# Patient Record
Sex: Male | Born: 2000 | Race: White | Hispanic: No | Marital: Single
Health system: Midwestern US, Community
[De-identification: ages and names within clinical notes are randomized; demographics above are authoritative.]

## PROBLEM LIST (undated history)

## (undated) DIAGNOSIS — S022XXA Fracture of nasal bones, initial encounter for closed fracture: Secondary | ICD-10-CM

## (undated) DIAGNOSIS — B360 Pityriasis versicolor: Secondary | ICD-10-CM

## (undated) HISTORY — DX: Fracture of nasal bones, initial encounter for closed fracture: S02.2XXA

## (undated) MED ORDER — ECONAZOLE 1 % TOPICAL CREAM
1 % | Freq: Two times a day (BID) | CUTANEOUS | Status: DC
Start: ? — End: 2013-09-15

---
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
## 2019-01-28 IMAGING — CT CT MAXILLOFACIAL W/O CM
3 of 5 series · 15 of 33 positions shown, 18 images · non-contrast
Comparison: None.

CLINICAL DATA: Initial evaluation for recent nasal trauma, pain,
evaluate for fracture.

EXAM:
CT MAXILLOFACIAL WITHOUT CONTRAST
TECHNIQUE: Multidetector CT imaging of the maxillofacial structures was
performed. Multiplanar CT image reconstructions were also generated.

[Series 4: facial/ orbits 2.0 h30s · axial · 0.28mm/px · z∈[+26,+136]mm · 11 of 67 slices shown, 14 images]
[im 6/67  soft-tissue]
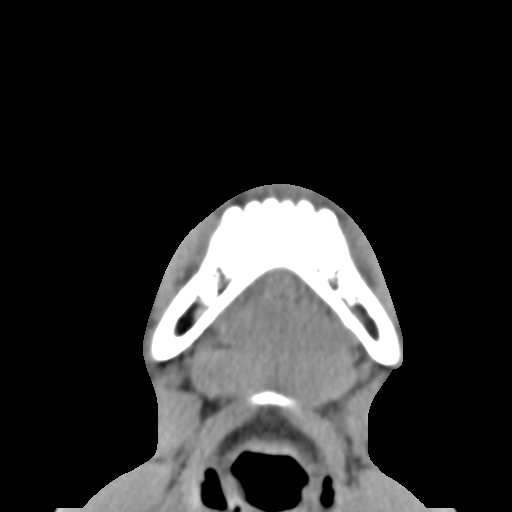
[im 6/67  bone]
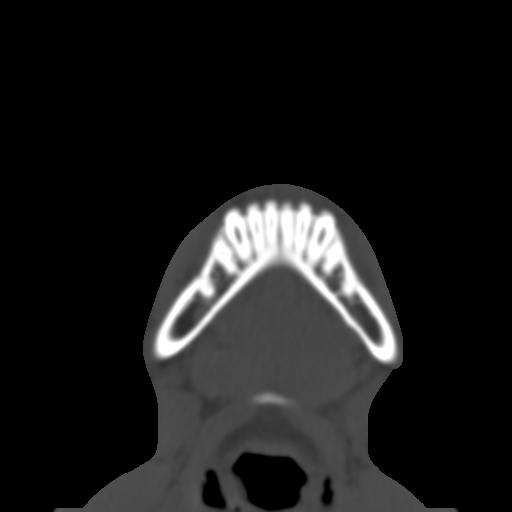
[im 12/67  bone]
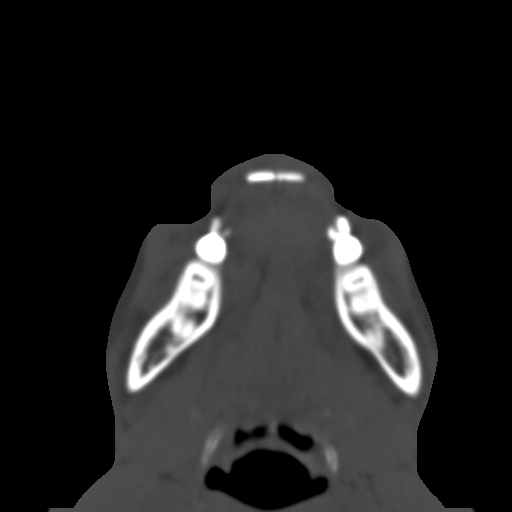
[im 17/67  bone]
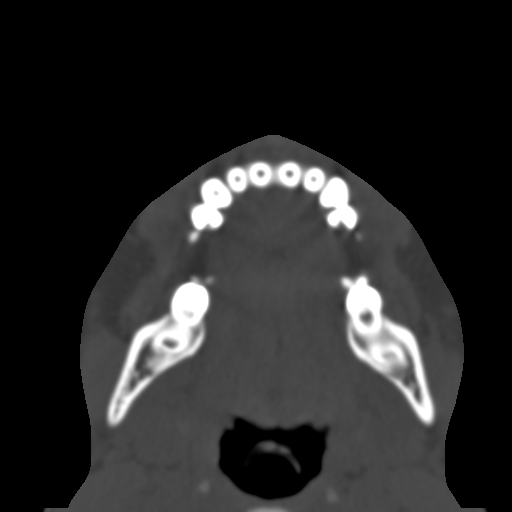
[im 23/67  bone]
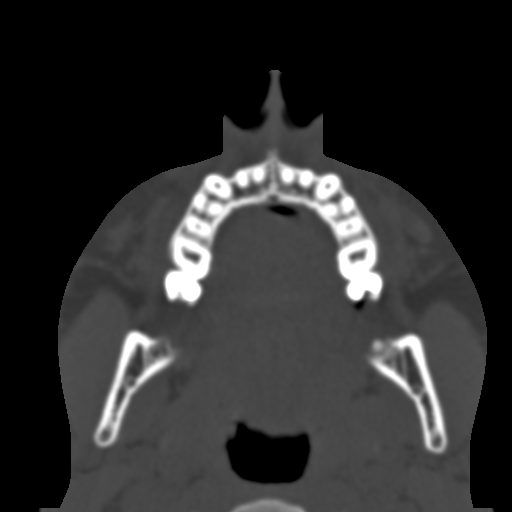
[im 28/67  soft-tissue]
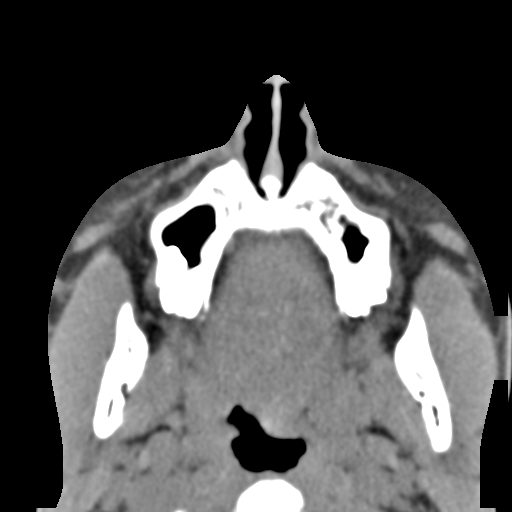
[im 28/67  bone]
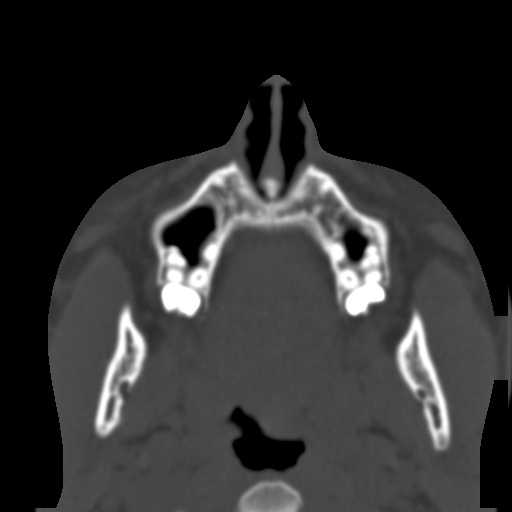
[im 34/67  bone]
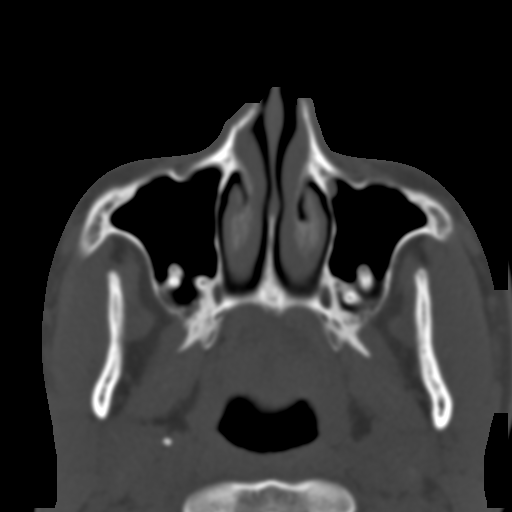
[im 39/67  bone]
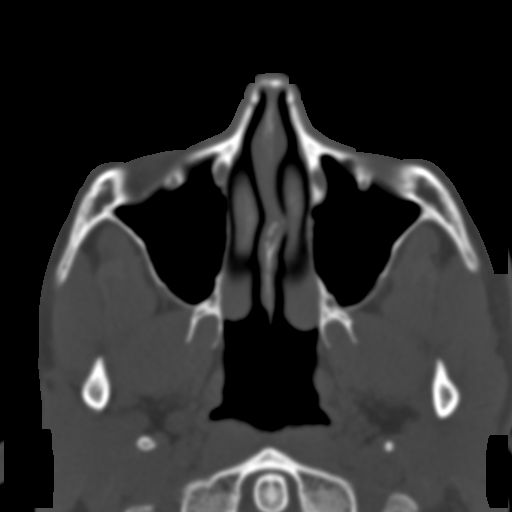
[im 45/67  bone]
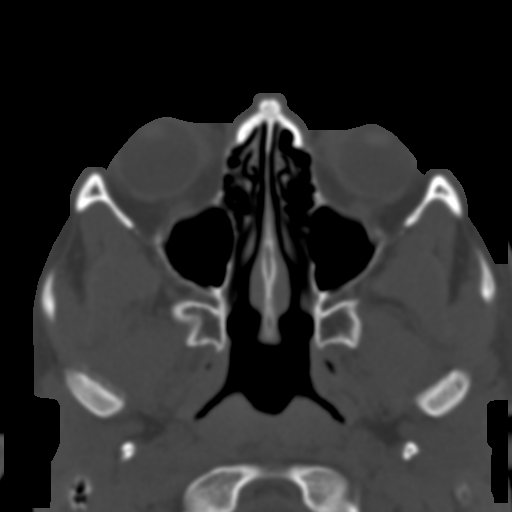
[im 50/67  soft-tissue]
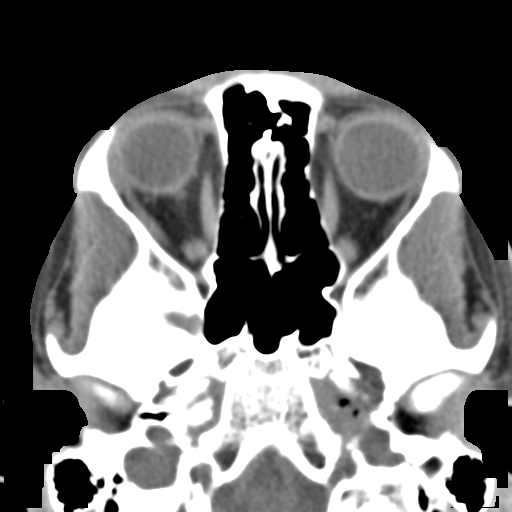
[im 50/67  bone]
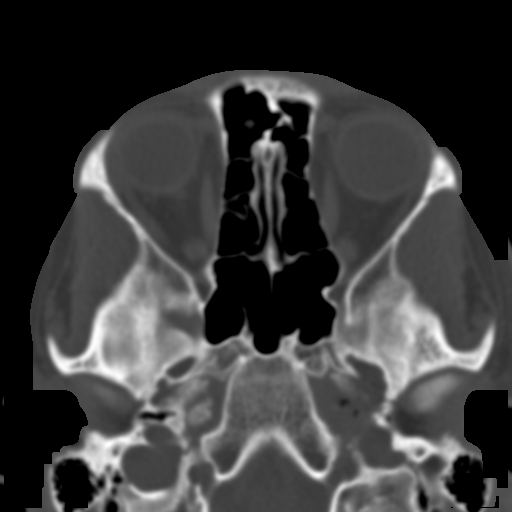
[im 56/67  bone]
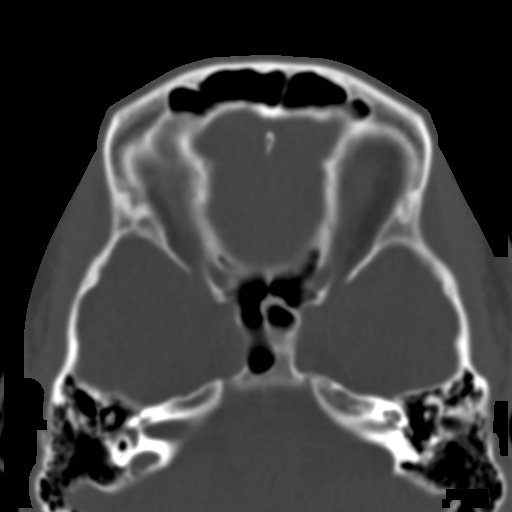
[im 61/67  bone]
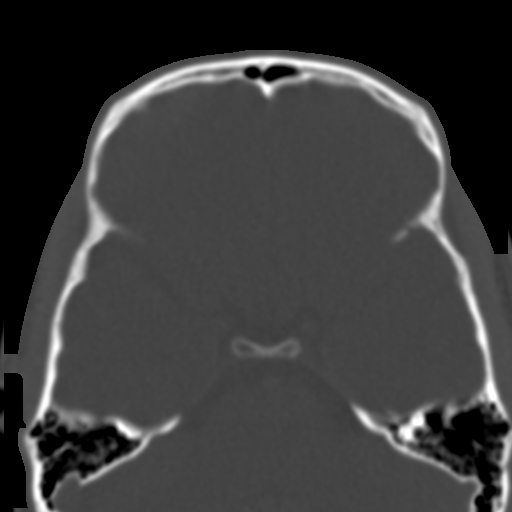

[Series 9: sagittal soft tissue · sagittal · 0.27mm/px · 3 of 78 slices shown]
[im 33/78  bone]
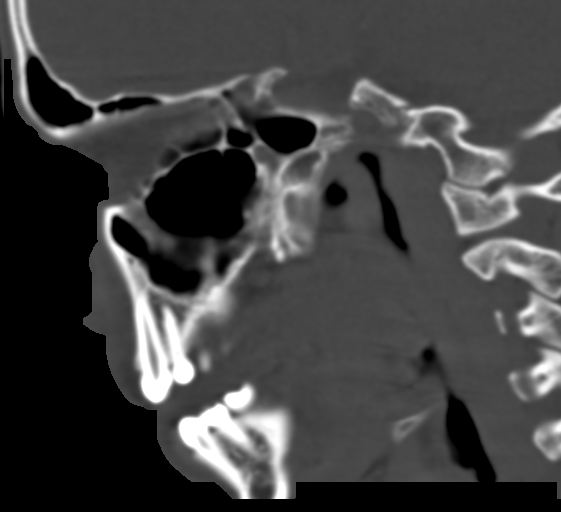
[im 39/78  bone]
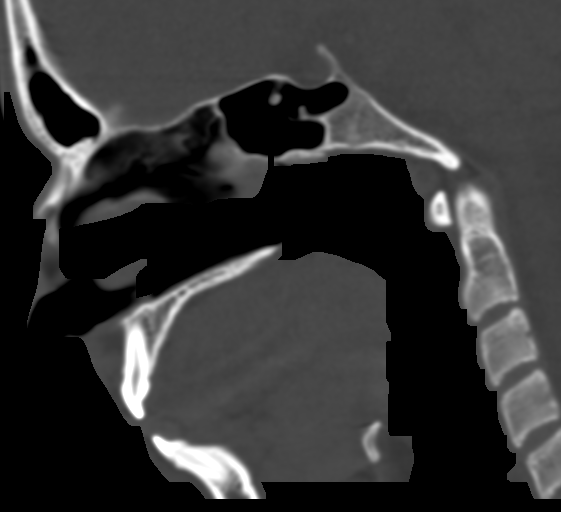
[im 45/78  bone]
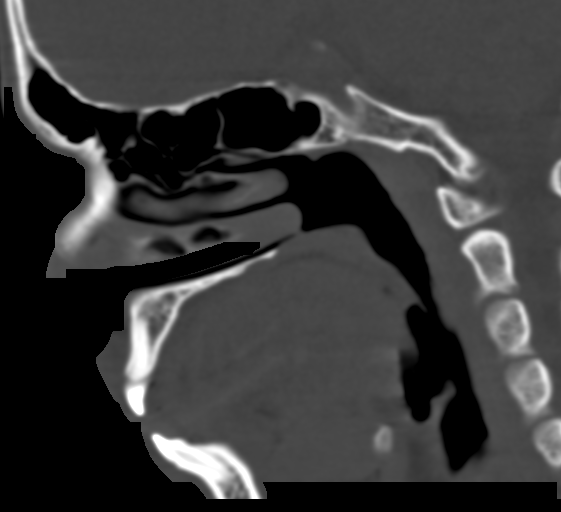

[Series 10: coronal bone · coronal · 0.29mm/px · 1 of 85 slices shown]
[im 43/85  bone]
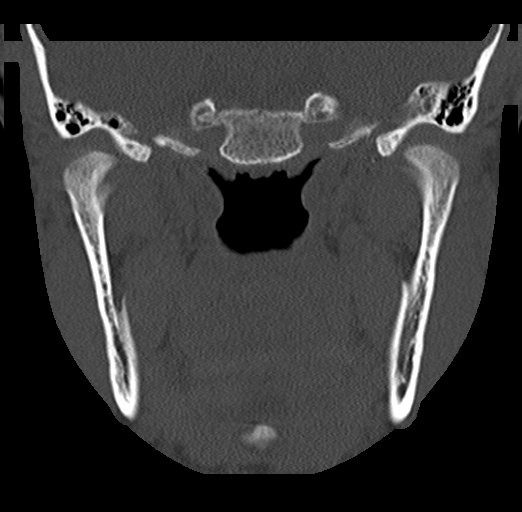

[15 of 33 positions shown; findings below may reference images not displayed]

FINDINGS: Osseous: Zygomatic arches intact. No acute maxillary fracture.
Pterygoid plates intact. There is an acute fracture involving the
right nasal bone with 2 mm depression (series 5, image 33). Left
nasal bone intact. Mild S-shaped bowing of the nasal septum which
remains intact. Visualized mandible intact. No acute abnormality
about the dentition.

Orbits: Globes and orbital soft tissues within normal limits. Bony
orbits intact.

Sinuses: Paranasal sinuses are clear. Visualized mastoids and middle
ear cavities are well pneumatized and clear.

Soft tissues: No appreciable soft tissue swelling by CT

Limited intracranial: Unremarkable.
IMPRESSION: Acute fracture involving the right nasal bone with 2 mm of
depression.

## 2019-10-04 ENCOUNTER — Other Ambulatory Visit: Payer: Self-pay

## 2019-10-04 ENCOUNTER — Ambulatory Visit (INDEPENDENT_AMBULATORY_CARE_PROVIDER_SITE_OTHER): Payer: BC Managed Care – PPO | Admitting: Internal Medicine

## 2019-10-04 ENCOUNTER — Encounter: Payer: Self-pay | Admitting: Internal Medicine

## 2019-10-04 VITALS — BP 106/74 | HR 78 | Temp 98.5°F | Wt 140.0 lb

## 2019-10-04 DIAGNOSIS — R0989 Other specified symptoms and signs involving the circulatory and respiratory systems: Secondary | ICD-10-CM

## 2019-10-04 DIAGNOSIS — R21 Rash and other nonspecific skin eruption: Secondary | ICD-10-CM

## 2019-10-04 DIAGNOSIS — R198 Other specified symptoms and signs involving the digestive system and abdomen: Secondary | ICD-10-CM

## 2019-10-04 MED ORDER — OMEPRAZOLE 20 MG PO CPDR: 20.0000 mg | DELAYED_RELEASE_CAPSULE | Freq: Every day | ORAL | 0 refills | Status: DC

## 2019-10-04 NOTE — Progress Notes (Signed)
Subjective:    Patient ID: Taylor Carr, male    DOB: [birth year], 19 y.o.   MRN: [id number]  HPI  Pt presents to the clinic today with c/o a sensation of "a lump in my throat". He noticed this 2-3 days ago. He reports he can drink water slowly but is having a hard time with other liquids and solids. He has not choked but he feels like for some reason things aren't passing. He reports he has had to cough food up and out after feeling like it wasn't moving. He denies post nasal drip, sore throat, cough, SOB, nausea, vomiting, reflux, abdominal pain, constipation, diarrhea or blood in his stool. He has tried Ibuprofen OTC with minimal relief.  He also c/p a rash to his left upper arm. He has seen derm in the past, unsure of what his diagnosis is. He was prescribed Selenium Sulfide shampoo which he has been using but reports no improvement. He reports this rash tends to get worse in the winter, better in the summer. He would like to know if there is anything else he can do to get this to go away.  Review of Systems      No past medical history on file.  Current Outpatient Medications  Medication Sig Dispense Refill  . omeprazole (PRILOSEC) 20 MG capsule Take 1 capsule (20 mg total) by mouth daily. 14 capsule 0   No current facility-administered medications for this visit.    No Known Allergies  No family history on file.  Social History   Socioeconomic History  . Marital status: Single    Spouse name: Not on file  . Number of children: Not on file  . Years of education: Not on file  . Highest education level: Not on file  Occupational History  . Not on file  Tobacco Use  . Smoking status: Never Smoker  . Smokeless tobacco: Never Used  Substance and Sexual Activity  . Alcohol use: No  . Drug use: No  . Sexual activity: Not on file  Other Topics Concern  . 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   Lives with mom, dad, brother, sister.   Enjoys playing basketball.   Social Determinants of Health   Financial Resource Strain:   . Difficulty of Paying Living Expenses: Not on file  Food Insecurity:   . Worried About Charity fundraiser in the Last Year: Not on file  . Ran Out of Food in the Last Year: Not on file  Transportation Needs:   . Lack of Transportation (Medical): Not on file  . Lack of Transportation (Non-Medical): Not on file  Physical Activity:   . Days of Exercise per Week: Not on file  . Minutes of Exercise per Session: Not on file  Stress:   . Feeling of Stress : Not on file  Social Connections:   . Frequency of Communication with Friends and Family: Not on file  . Frequency of Social Gatherings with Friends and Family: Not on file  . Attends Religious Services: Not on file  . Active Member of Clubs or Organizations: Not on file  . Attends Archivist Meetings: Not on file  . Marital Status: Not on file  Intimate Partner Violence:   . Fear of Current or Ex-Partner: Not on file  . Emotionally Abused: Not on file  . Physically Abused: Not on file  . Sexually Abused: Not on file     Constitutional:  Denies fever, malaise, fatigue, headache or abrupt weight changes.  Skin: Pt reports rash of left upper arm. Denies redness, lesion or ulceration. HEENT: Denies eye pain, eye redness, ear pain, ringing in the ears, wax buildup, runny nose, nasal congestion, bloody nose, or sore throat. Respiratory: Denies difficulty breathing, shortness of breath, cough or sputum production.   Cardiovascular: Denies chest pain, chest tightness, palpitations or swelling in the hands or feet.  Gastrointestinal: Pt reports globus sensation. Denies abdominal pain, bloating, constipation, diarrhea or blood in the stool.  GU: Denies urgency, frequency, pain with urination, burning sensation, blood in urine, odor or discharge.  No other specific complaints in a complete review of systems (except as listed in HPI  above).  Objective:   Physical Exam  BP 106/74   Pulse 78   Temp 98.5 F (36.9 C) (Temporal)   Wt 140 lb (63.5 kg)   SpO2 98%   BMI 21.44 kg/m  Wt Readings from Last 3 Encounters:  10/04/19 140 lb (63.5 kg) (26 %, Z= -0.63)*  08/10/18 131 lb 8 oz (59.6 kg) (19 %, Z= -0.86)*  11/23/17 133 lb (60.3 kg) (27 %, Z= -0.61)*   * Growth percentiles are based on CDC (Boys, 2-20 Years) data.    General: Appears his stated age, well developed, well nourished in NAD. Skin: Grouped hypopigmented, some scaly lesions with central clearing noted of left upper arm.  HEENT: Head: normal shape and size; Eyes: sclera white, no icterus, conjunctiva pink, PERRLA and EOMs intact; Throat/Mouth: Teeth present, mucosa pink and moist, no exudate, lesions or ulcerations noted.  Neck:  Neck supple, trachea midline. No masses, lumps or thyromegaly present.  Cardiovascular: Normal rate and rhythm. S1,S2 noted.  No murmur, rubs or gallops noted.  Pulmonary/Chest: Normal effort and positive vesicular breath sounds. No respiratory distress. No wheezes, rales or ronchi noted.  Abdomen: Soft and nontender. Normal bowel sounds. No distention or masses noted.  Neurological: Alert and oriented.            Assessment & Plan:   Globus Sensation:  RX for Omeprazole 20 mg daily 30 minutes before breakfast If no improvement, consider referral to GI for further evaluation  Rash of Arm:  Follow up with derm if no improvement  Return precautions discussed Nicki Reaper, NP This visit occurred during the SARS-CoV-2 public health emergency.  Safety protocols were in place, including screening questions prior to the visit, additional usage of staff PPE, and extensive cleaning of exam room while observing appropriate contact time as indicated for disinfecting solutions.

## 2019-10-05 ENCOUNTER — Encounter: Payer: Self-pay | Admitting: Internal Medicine

## 2019-10-05 NOTE — Patient Instructions (Signed)

## 2019-12-06 ENCOUNTER — Ambulatory Visit: Payer: BC Managed Care – PPO | Admitting: Dermatology

## 2019-12-06 ENCOUNTER — Other Ambulatory Visit: Payer: Self-pay

## 2019-12-06 DIAGNOSIS — L305 Pityriasis alba: Secondary | ICD-10-CM

## 2019-12-06 DIAGNOSIS — L309 Dermatitis, unspecified: Secondary | ICD-10-CM

## 2019-12-06 MED ORDER — MOMETASONE FUROATE 0.1 % EX CREA: TOPICAL_CREAM | CUTANEOUS | 2 refills | Status: DC

## 2019-12-06 MED ORDER — PIMECROLIMUS 1 % EX CREA: TOPICAL_CREAM | CUTANEOUS | 2 refills | Status: DC

## 2019-12-06 NOTE — Progress Notes (Signed)
   New Patient Visit  Subjective  Taylor Carr is a 19 y.o. male who presents for the following: white patches (arms, legs, back). He states he gets scaly patches that come up in the winter, and gradually fade during the summer. The spots are itchy when present and uses Eucerin lotion. He has used [employer] in the past, but hasn't helped much.   The following portions of the chart were reviewed this encounter and updated as appropriate:      Review of Systems:  No other skin or systemic complaints except as noted in HPI or Assessment and Plan.  Objective  Well appearing patient in no apparent distress; mood and affect are within normal limits.  A focused examination was performed including arms, legs, back. Relevant physical exam findings are noted in the Assessment and Plan.  Objective  arms, legs: Scaly small papules and patches with hypopigmentation   Assessment & Plan  Eczema, unspecified type arms, legs  with PIPA (pityriasis alba)  Start Mometasone Cream Apply to affected areas rash BID until improved ~ 2 weeks. Avoid face, groin, axilla. Topical steroids (such as triamcinolone, fluocinolone, fluocinonide, mometasone, clobetasol, halobetasol, betamethasone, hydrocortisone) can cause thinning and lightening of the skin if they are used for too long in the same area. Your physician has selected the right strength medicine for your problem and area affected on the body. Please use your medication only as directed by your physician to prevent side effects.   Start Elidel Cream Apply to affected areas QD/BID until discoloration improved.  Continue Selsun Blue to scalp. If worsens or not improving, will send in prescription shampoo.  Recommend broad spectrum SPF 30+ and photoprotection   Recommend mild soap and moisturizing cream 1-2 times daily.      mometasone (ELOCON) 0.1 % cream - arms, legs  pimecrolimus (ELIDEL) 1 % cream - arms, legs  Return if symptoms worsen  or fail to improve.   ICherlyn Labella, CMA, am acting as scribe for Willeen Niece, MD .     Documentation: I have reviewed the above documentation for accuracy and completeness, and I agree with the above.  Willeen Niece, MD

## 2019-12-06 NOTE — Patient Instructions (Addendum)
Atopic Dermatitis ° °“Dermatitis” means inflammation of the skin.  “Atopic” dermatitis is a particular type of skin inflammation that is marked by dryness, associated itching, and a characteristic pattern of rash on the body.  The condition is fairly common and may occur in as many as 10% of children.  You will often hear it called “atopic eczema” or sometimes just “eczema”. ° °The exact cause of atopic dermatitis is unknown.  In many patients, there is a family history of hay fever, asthma, or atopic dermatitis itself.  Rarely, atopic dermatitis in infants may be related to food sensitivity, such as sensitivity to milk, but this is often difficult to determine and manage.  In the majority of cases, however, no allergic triggers can be found.  Physical or emotional stressors (severe seasonal allergies, physical illness, etc.) can worsen atopic dermatitis. ° °Atopic dermatitis usually starts in infancy from the ages of 2 to 6 months.  The skin is dry and the rash is quite itchy, so infants may be restless and rub against the sheets or scratch (if able).  The rash may involve the face or it may cover a large part of the body.  As the child gets older, the rash may become more localized.  In early childhood, the rash is commonly on the legs, feet, hands and arms.  As a child becomes older, the rash may be limited to the bend of the elbows, knees, on the back of the hands, feet, and on the neck and face.  When the rash becomes more established, the dry itchy skin may become thickened, leathery and sometimes darker in coloration.  The more the person scratches, the worse the rash is and the thicker the skin gets.  Many children with atopic dermatitis outgrow the condition before school age, while others continue to have problems into adolescence and adulthood. ° °Many things may affect the severity of the condition.  All patients have sensitive and dry skin.  Many will find that during the winter months when the humidity  is very low, the dryness and itchiness will be worse.  On the other hand, some people are easily irritated by sweat and will find that they have more problems during the summer months.  Most patients note an increase in itching at times when there are sudden changes in temperature.  Other irritants easily affect the skin of a patient with atopic dermatitis.  Use of harsh soaps or detergents and exposure to wool are common problems.  Sometimes atopic dermatitis may become infected by bacteria, yeast or viruses.  This is called “secondary infection”.  Bacterial secondary infection is the most common and is often a result of scratching.  The rash gets very red with pus-filled pimples and scabs.  If this occurs, your doctor will prescribe an antibiotic to control the infection.  A more serious complication can be caused by certain viruses.  The “cold sore” virus (herpes simplex) may cause a severe rash.  If this is suspected, immediately contact your doctor.  ° °What can I expect from treatment? °Unfortunately, there is no “magic” cure that will always eliminate atopic dermatitis.  The main objective in treating atopic dermatitis is to decrease the skin eruption and relieve the itching.  There are a number of different forms of the medications that are used for atopic dermatitis.  Primarily, topical medications will be used.  Because the skin is excessively dry, moisturizers will be recommended that will effectively decrease the dryness.  Daily bathing is   a useful way to get water into the skin but bathing should be brief (no more than 10 minutes unless otherwise indicated by your physician). ° °Effective moisturizers (Cetaphil cream or lotion, CeraVe cream or lotion [[employer], [employer], and Walgreens], Aquaphor, and plain Vaseline) can be used immediately after the bath or shower to trap moisture within the skin.  It is best to “pat dry” after a bathing and then place your moisturizer (cream or lotion) on your skin.   Cortisone (steroid) is a medicated ointment or cream (eg. triamcinolone, hydrocortisone, desonide, betamethasone, clobetasol) that may also be suggested.  It is very helpful in decreasing the itching and controlling the inflammation.  Your doctor will prescribe a cortisone treatment that is most appropriate for the severity and location of the dermatitis that is to be treated.  ° °Once the affected area clears up, it is best to discontinue the use of the cortisone preparation due to possibility of atrophy (skin thinning), but continue the regular use of moisturizers to try to prevent new areas of dermatitis from occurring.  Of course, if itching or a new rash begins, the cortisone preparation may have to be started again.  Anti-inflammatory creams and ointments which are not steroids such as Protopic and Elidel may also be prescribed. ° °Certain internal medicines called antihistamines (eg. Atarax, Benadryl, hydroxyzine) may help control itching.  They primarily help with the itching by introducing some drowsiness and allowing you to sleep at night.  Some oral antibiotics are often useful as well for controlling the secondary infection and enable infected dermatitis to be controlled. ° °Other important forms of treatment: °1. Avoid contact with substances you know to cause itching.  These may include soaps, detergents, certain perfumes, dust, grass, weeds, wools, and other types of scratchy clothing. °2. You may bathe daily.  Use no soap or the minimal amount necessary to get clean.  Always use moisturizer immediately after bathing (within 3 minutes is best).  Avoid very hot or very cold water.  Avoid bubble baths.  When drying with a towel, pat dry and do not rub. Use a mild, unscented soap (Dove, CeraVe Cleanser, Lever 2000, or Cetaphil). °3. Try to keep the temperature and humidity in the home fairly constant.  Use a bedroom air conditioner in the summer and a humidifier in the winter.  It is very important that  the humidifier be cleaned frequently and thoroughly since mold may grow and cause allergies. °4. Try to avoid scratching.  Atopic dermatitis is often called “the itch that rashes” and it is known that scratching plays a significant role in making atopic dermatitis worse.  Keeping the nails short and well-filed is helpful. °5. Use a fragrance-free, sensitive skin laundry detergent (eg. All Free & Clear).  Run clothes through a second rinse cycle to remove any residual detergents and chemicals.  Bed linens and towels should be washed in hot water to kill dust mites, which are common allergen in atopic patients. °6. In the bedroom, minimize rugs and curtains or other loose fabrics that collect dust. ° °The National Eczema Association (www.eczema-assn.org) is a wonderful organization that sends out a quarterly newsletter with useful information on these types of conditions. Please consider contacting them at the above website or by address: [employer] Ecz[employer] for [employer], [address], [address], [place], [address]  ° ° °

## 2020-09-13 DIAGNOSIS — R072 Precordial pain: Secondary | ICD-10-CM | POA: Diagnosis not present

## 2021-09-02 ENCOUNTER — Encounter: Payer: BC Managed Care – PPO | Admitting: Primary Care

## 2021-09-06 ENCOUNTER — Other Ambulatory Visit: Payer: Self-pay

## 2021-09-06 ENCOUNTER — Ambulatory Visit (INDEPENDENT_AMBULATORY_CARE_PROVIDER_SITE_OTHER): Payer: BC Managed Care – PPO | Admitting: Family

## 2021-09-06 ENCOUNTER — Telehealth: Payer: Self-pay

## 2021-09-06 ENCOUNTER — Encounter: Payer: Self-pay | Admitting: Family

## 2021-09-06 VITALS — BP 114/72 | HR 106 | Temp 97.9°F | Ht 67.75 in | Wt 139.0 lb

## 2021-09-06 DIAGNOSIS — R9431 Abnormal electrocardiogram [ECG] [EKG]: Secondary | ICD-10-CM | POA: Diagnosis not present

## 2021-09-06 DIAGNOSIS — F419 Anxiety disorder, unspecified: Secondary | ICD-10-CM | POA: Diagnosis not present

## 2021-09-06 DIAGNOSIS — R000 Tachycardia, unspecified: Secondary | ICD-10-CM | POA: Insufficient documentation

## 2021-09-06 DIAGNOSIS — F411 Generalized anxiety disorder: Secondary | ICD-10-CM | POA: Insufficient documentation

## 2021-09-06 LAB — T3, FREE: T3, Free: 3.4 pg/mL (ref 2.3–4.2)

## 2021-09-06 LAB — COMPREHENSIVE METABOLIC PANEL
ALT: 22 U/L (ref 0–53)
AST: 23 U/L (ref 0–37)
Albumin: 4.9 g/dL (ref 3.5–5.2)
Alkaline Phosphatase: 59 U/L (ref 39–117)
BUN: 17 mg/dL (ref 6–23)
CO2: 32 mEq/L (ref 19–32)
Calcium: 10.3 mg/dL (ref 8.4–10.5)
Chloride: 100 mEq/L (ref 96–112)
Creatinine, Ser: 0.93 mg/dL (ref 0.40–1.50)
GFR: 117.46 mL/min (ref >60.00)
Glucose, Bld: 78 mg/dL (ref 70–99)
Potassium: 4.2 mEq/L (ref 3.5–5.1)
Sodium: 137 mEq/L (ref 135–145)
Total Bilirubin: 0.5 mg/dL (ref 0.2–1.2)
Total Protein: 7.8 g/dL (ref 6.0–8.3)

## 2021-09-06 LAB — CBC WITH DIFFERENTIAL/PLATELET
Basophils Absolute: 0 10*3/uL (ref 0.0–0.1)
Basophils Relative: 0.3 % (ref 0.0–3.0)
Eosinophils Absolute: 0 10*3/uL (ref 0.0–0.7)
Eosinophils Relative: 0.5 % (ref 0.0–5.0)
HCT: 45.9 % (ref 39.0–52.0)
Hemoglobin: 15.6 g/dL (ref 13.0–17.0)
Lymphocytes Relative: 21.5 % (ref 12.0–46.0)
Lymphs Abs: 1.5 10*3/uL (ref 0.7–4.0)
MCHC: 34.1 g/dL (ref 30.0–36.0)
MCV: 84.4 fl (ref 78.0–100.0)
Monocytes Absolute: 0.4 10*3/uL (ref 0.1–1.0)
Monocytes Relative: 6.2 % (ref 3.0–12.0)
Neutro Abs: 5.1 10*3/uL (ref 1.4–7.7)
Neutrophils Relative %: 71.5 % (ref 43.0–77.0)
Platelets: 312 10*3/uL (ref 150.0–400.0)
RBC: 5.43 Mil/uL (ref 4.22–5.81)
RDW: 13.2 % (ref 11.5–15.5)
WBC: 7.1 10*3/uL (ref 4.0–10.5)

## 2021-09-06 LAB — TSH: TSH: 0.76 u[IU]/mL (ref 0.35–5.50)

## 2021-09-06 LAB — T4, FREE: Free T4: 0.75 ng/dL (ref 0.60–1.60)

## 2021-09-06 MED ORDER — HYDROXYZINE PAMOATE 50 MG PO CAPS: 50.0000 mg | ORAL_CAPSULE | Freq: Three times a day (TID) | ORAL | 0 refills | Status: AC | PRN

## 2021-09-06 NOTE — Telephone Encounter (Signed)
Pt is checking in at front desk window now; I did ask pt if he was having any CP now or in any distress now and pt said no not at this time. Sending note to T Dugal FNP and [facility] CMA. Pt has appt to see Red Christians NP now.

## 2021-09-06 NOTE — Assessment & Plan Note (Signed)
abn ekg in office with sinus rhythm/ first ekg with tachycardia. Non-acute concave ST elevations slight <2 mm V3, V4  Referral for cardiology, pending referral. Will treat for anxiety as well as this may help  Limit caffeine.  Pt advised if any chest pain/racing heart that will not stop with relaxation techniques go to er and or call 911

## 2021-09-06 NOTE — Telephone Encounter (Signed)
[facility] - Client TELEPHONE ADVICE RECORD [facility]urse Patient Name: Taylor Carr Gender: Male DOB: [birth year] Age: 21 Y 4 M 25 D Return Phone Number: [phone] (Primary) Address[place]  [address] Client [name] - Client Client Site [facility] - Day Provider Vernona Rieger - NP Contact Type Call Who Is Calling Patient / Member / Family / Caregiver Call Type Triage / Clinical Relationship To Patient Self Return Phone Number [phone] (Primary) Chief Complaint CHEST PAIN - pain, pressure, heaviness or tightness Reason for Call Symptomatic / Request for Health Information Initial Comment Caller states he is having chest pain and tightness. Translation No Nurse Assessment Nurse: Lexine Baton, RN, Belenda Cruise Date/Time (Eastern Time): 09/06/2021 9:48:21 AM Confirm and document reason for call. If symptomatic, describe symptoms. ---Caller states that he is having chest pain and tightness within the last week. Had a panic attack three weeks ago. He has been having some shortness of breath and chest tightness for about a week. Does the patient have any new or worsening symptoms? ---Yes Will a triage be completed? ---Yes Related visit to physician within the last 2 weeks? ---No Does the PT have any chronic conditions? (i.e. diabetes, asthma, this includes High risk factors for pregnancy, etc.) ---No Is this a behavioral health or substance abuse call? ---No Guidelines Guideline Title Affirmed Question Affirmed Notes Nurse Date/Time [name] Time) Chest Pain [1] Chest pain(s) lasting a few seconds AND [2] persists > 3 days Lexine Baton, RN, Belenda Cruise 09/06/2021 9:50:14 AM Disp. Time [name] Time) Disposition Final User 09/06/2021 9:46:15 AM Send to Urgent [name] 09/06/2021 9:58:14 AM SEE PCP WITHIN 3 DAYS Yes Donadeo, RN, Belenda Cruise PLEASE NOTE: All timestamps  contained within this report are represented as [place] Standard Time. CONFIDENTIALTY NOTICE: This fax transmission is intended only for the addressee. It contains information that is legally privileged, confidential or otherwise protected from use or disclosure. If you are not the intended recipient, you are strictly prohibited from reviewing, disclosing, copying using or disseminating any of this information or taking any action in reliance on or regarding this information. If you have received this fax in error, please notify us immediately by telephone so that we can arrange for its return to [place]. Phone: [phone], Toll-Free: [phone], Fax: [phone] Page: 2 of 2 Call Id: [id number] Caller Disagree/Comply Comply Caller Understands Yes PreDisposition Call Doctor Care Advice Given Per Guideline SEE PCP WITHIN 3 DAYS: * You need to be seen within 2 or 3 days. CALL BACK IF: * Chest pain increases in frequency, duration or severity * Chest pain lasts over 5 minutes * Difficulty breathing or unusual sweating occurs * Fever over 100.4 F (38.0 C) * You become worse CARE ADVICE given per Chest Pain (Adult) guideline

## 2021-09-06 NOTE — Assessment & Plan Note (Signed)
Prescription for hydroxyzine given to patient as needed for anxiety attacks.  Referral for psychology in place.  Patient denies psychiatry following this time.  Given handout for anxiety reducing techniques as well.  Ruling out other etiologies for increased anxiety with lab work as well.

## 2021-09-06 NOTE — Assessment & Plan Note (Signed)
Suspected anxiety however will rule out with EKG today and also lab work to include TSH CBC and CMP.  Rule out any hypothyroid disorders and or anemia that may be working into this.

## 2021-09-06 NOTE — Patient Instructions (Addendum)
A referral was placed today for psychology as well as cardiology.  Please let us know if you have not heard back within 1 week about your referral.  I have prescribed hydroxyxine for you to take as needed for anxiety.  Please read the handout as well for reducing anxiety.   Stop by the lab prior to leaving today. I will notify you of your results once received.   If you have any worsening chest pain that does not go away, shortness of breath, go to the ER.   It was a pleasure seeing you today! Please do not hesitate to reach out with any questions and or concerns.  Regards,   Mort Sawyers FNP-C

## 2021-09-06 NOTE — Progress Notes (Signed)
Established Patient Office Visit  Subjective:  Patient ID: Taylor Carr, male    DOB: 02/26/2001  Age: 21 y.o. MRN: [id number]  CC:  Chief Complaint  Patient presents with   Anxiety    HPI Densel Kronick is here today with concerns.   Three weeks ago had a panic attack, probably his first ever panic attack, came out of nowhere while upstairs watching tv lying down relaxing- states out of nowhere felt racing heart beat, went downstairs to get some water and then felt really lightheaded. States had this racing sensation for about one hour, along with dry mouth, and felt like he had to keep walking and moving around was the only thing that improved it. Finally after about one hour was able to relax and calm down. Denies tingling in hands or feet when this occurs.  Later that week had another brief episode and was able to mentally control it a little better. One episode while at work in the last two weeks did get anxious again, walked outside- regathered himself, and came back inside feeling like was able to control his anxiety.   Over the last week tightness on left side of lateral chest , feels sore at times. Every once in a while feels harder to breath, usually when he starts thinking about his breathing. Starts to focus on breathing better and this improves the sensation.   In the last 3-4 months there have been a lot of new transitions causing anxiety. Parents divorced, dating other people. He recently was heartbroken over a 4 year relationship. Did move out about one year ago on his own, and then had to move back in with his mom recently as well. Worried about furthering his career and getting his next car etc.   Does admit to taking 1/2 mom's xanax and 1/2 bupropion during most recent panic attack and did help him calm down.  GAD 7 : Generalized Anxiety Score 09/06/2021  Nervous, Anxious, on Edge 3  Control/stop worrying 0  Worry too much - different things 1  Trouble relaxing 1   Restless 0  Easily annoyed or irritable 0  Afraid - awful might happen 0  Total GAD 7 Score 5    PHQ9 SCORE ONLY 09/06/2021 08/10/2018  PHQ-9 Total Score 8 0     Social History   Socioeconomic History   Marital status: Single    Spouse name: Not on file   Number of children: Not on file   Years of education: Not on file   Highest education level: Not on file  Occupational History   Not on file  Tobacco Use   Smoking status: Never   Smokeless tobacco: Never  Substance and Sexual Activity   Alcohol use: No   Drug use: No   Sexual activity: Not on file  Other Topics Concern   Not on file  Social History Narrative   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   Lives with mom, dad, brother, sister.   Enjoys playing basketball.   Social Determinants of Health   Financial Resource Strain: Not on file  Food Insecurity: Not on file  Transportation Needs: Not on file  Physical Activity: Not on file  Stress: Not on file  Social Connections: Not on file  Intimate Partner Violence: Not on file    Outpatient Medications Prior to Visit  Medication Sig Dispense Refill   mometasone (ELOCON) 0.1 % cream Apply to affected areas BID until rash improved. Avoid groin, underarms, face. 45 g 2  omeprazole (PRILOSEC) 20 MG capsule Take 1 capsule (20 mg total) by mouth daily. (Patient not taking: Reported on 12/06/2019) 14 capsule 0   pimecrolimus (ELIDEL) 1 % cream Apply to affected areas QD/BID until discoloration improved. 60 g 2   No facility-administered medications prior to visit.    No Known Allergies  ROS Review of Systems  Constitutional:  Negative for chills, fatigue, fever and unexpected weight change.  Respiratory:  Positive for chest tightness (with increasing anxiety) and shortness of breath (at times of panic). Negative for cough and wheezing.   Cardiovascular:  Negative for chest pain, palpitations and leg swelling.       Racing heart rate   Psychiatric/Behavioral:  Negative  for agitation, decreased concentration, self-injury, sleep disturbance and suicidal ideas. The patient is nervous/anxious.      Objective:    Physical Exam Constitutional:      General: He is not in acute distress.    Appearance: Normal appearance. He is normal weight. He is not ill-appearing, toxic-appearing or diaphoretic.  HENT:     Head: Normocephalic.  Cardiovascular:     Rate and Rhythm: Tachycardia present.     Pulses: Normal pulses.     Heart sounds: Normal heart sounds.     Comments: Tachycardia intermittently especially when pt starts talking about health  Pulmonary:     Effort: Pulmonary effort is normal.     Breath sounds: Normal breath sounds.  Skin:    General: Skin is warm.  Neurological:     General: No focal deficit present.     Mental Status: He is alert and oriented to person, place, and time.     Cranial Nerves: No cranial nerve deficit.     Coordination: Coordination normal.  Psychiatric:        Mood and Affect: Mood normal.        Behavior: Behavior normal.        Thought Content: Thought content normal.        Judgment: Judgment normal.    BP 114/72    Pulse (!) 106 Comment: manual   Temp 97.9 F (36.6 C) (Temporal)    Ht 5' 7.75" (1.721 m)    Wt 139 lb (63 kg)    SpO2 97%    BMI 21.29 kg/m  Wt Readings from Last 3 Encounters:  09/06/21 139 lb (63 kg)  10/04/19 140 lb (63.5 kg) (26 %, Z= -0.63)*  08/10/18 131 lb 8 oz (59.6 kg) (19 %, Z= -0.86)*   * Growth percentiles are based on CDC (Boys, 2-20 Years) data.     Health Maintenance Due  Topic Date Due   HIV Screening  Never done   Hepatitis C Screening  Never done   TETANUS/TDAP  11/17/2020   INFLUENZA VACCINE  Never done    There are no preventive care reminders to display for this patient.  No results found for: TSH No results found for: WBC, HGB, HCT, MCV, PLT No results found for: NA, K, CHLORIDE, CO2, GLUCOSE, BUN, CREATININE, BILITOT, ALKPHOS, AST, ALT, PROT, ALBUMIN, CALCIUM,  ANIONGAP, EGFR, GFR No results found for: HGBA1C    Assessment & Plan:   Problem List Items Addressed This Visit       Other   Anxiety    Prescription for hydroxyzine given to patient as needed for anxiety attacks.  Referral for psychology in place.  Patient denies psychiatry following this time.  Given handout for anxiety reducing techniques as well.  Ruling out other etiologies  for increased anxiety with lab work as well.      Relevant Medications   hydrOXYzine (VISTARIL) 50 MG capsule   Other Relevant Orders   EKG 12-Lead (Completed)   TSH   T4, free   T3, free   CBC w/Diff   Comprehensive metabolic panel   Ambulatory referral to Psychology   Fast heart beat - Primary    Suspected anxiety however will rule out with EKG today and also lab work to include TSH CBC and CMP.  Rule out any hypothyroid disorders and or anemia that may be working into this.       Relevant Orders   EKG 12-Lead (Completed)   TSH   T4, free   T3, free   CBC w/Diff   Comprehensive metabolic panel   Ambulatory referral to Cardiology   Abnormal EKG    abn ekg in office with sinus rhythm/ first ekg with tachycardia. Non-acute concave ST elevations slight <2 mm V3, V4  Referral for cardiology, pending referral. Will treat for anxiety as well as this may help  Limit caffeine.  Pt advised if any chest pain/racing heart that will not stop with relaxation techniques go to er and or call 911      Relevant Orders   Ambulatory referral to Cardiology    Meds ordered this encounter  Medications   hydrOXYzine (VISTARIL) 50 MG capsule    Sig: Take 1 capsule (50 mg total) by mouth 3 (three) times daily as needed for anxiety.    Dispense:  30 capsule    Refill:  0    Order Specific Question:   Supervising Provider    Answer:   BEDSOLE, AMY E [[id number]]    Follow-up: Return if symptoms worsen or fail to improve.    Eugenia Pancoast, FNP

## 2021-09-09 NOTE — Progress Notes (Signed)
Labs are unremarkable, pt is not with mychart.

## 2021-09-27 ENCOUNTER — Ambulatory Visit (INDEPENDENT_AMBULATORY_CARE_PROVIDER_SITE_OTHER): Payer: BC Managed Care – PPO | Admitting: Psychologist

## 2021-09-27 ENCOUNTER — Ambulatory Visit: Payer: BC Managed Care – PPO | Admitting: Internal Medicine

## 2021-09-27 ENCOUNTER — Telehealth: Payer: BC Managed Care – PPO

## 2021-09-27 ENCOUNTER — Encounter: Payer: Self-pay | Admitting: Internal Medicine

## 2021-09-27 ENCOUNTER — Other Ambulatory Visit: Payer: Self-pay

## 2021-09-27 VITALS — BP 128/70 | HR 128 | Temp 98.0°F | Ht 68.0 in | Wt 137.0 lb

## 2021-09-27 DIAGNOSIS — F411 Generalized anxiety disorder: Secondary | ICD-10-CM | POA: Diagnosis not present

## 2021-09-27 DIAGNOSIS — R300 Dysuria: Secondary | ICD-10-CM | POA: Insufficient documentation

## 2021-09-27 LAB — POC URINALSYSI DIPSTICK (AUTOMATED)
Bilirubin, UA: NEGATIVE
Blood, UA: NEGATIVE
Glucose, UA: NEGATIVE
Ketones, UA: NEGATIVE
Nitrite, UA: NEGATIVE
Protein, UA: NEGATIVE
Spec Grav, UA: 1.01 (ref 1.010–1.025)
Urobilinogen, UA: 0.2 E.U./dL
pH, UA: 8 (ref 5.0–8.0)

## 2021-09-27 MED ORDER — CEFTRIAXONE SODIUM 1 G IJ SOLR
0.5000 g | Freq: Once | INTRAMUSCULAR | Status: AC
  Administered 2021-09-27: 0.5 g via INTRAMUSCULAR

## 2021-09-27 MED ORDER — DOXYCYCLINE MONOHYDRATE 100 MG PO CAPS: 100.0000 mg | ORAL_CAPSULE | Freq: Two times a day (BID) | ORAL | 0 refills | Status: DC

## 2021-09-27 NOTE — Progress Notes (Signed)
                Atom Solivan, PsyD 

## 2021-09-27 NOTE — Addendum Note (Signed)
Addended by: Eual Fines on: 09/27/2021 12:38 PM   Modules accepted: Orders

## 2021-09-27 NOTE — Plan of Care (Signed)

## 2021-09-27 NOTE — Assessment & Plan Note (Addendum)
Better today after more symptoms yesterday Negative exam Only recent sex was with condom but have to presume urethritis Will give rocephin 500mg  IM now Doxy 100 bid for 7 days  GC/chlamydia NAT Discussed trying NSAIDs as well

## 2021-09-27 NOTE — Progress Notes (Signed)
[facility] Counselor Initial Adult Exam  Name: Chrstopher Malenfant Date: 09/27/2021 MRN: [id number] DOB: 2001-02-12 PCP: Doreene Nest, NP  Time spent: 11:05 am to 11:25 am; total time: 20 minutes  This session was held via video webex teletherapy due to the coronavirus risk at this time. The patient consented to video teletherapy and was located at his home during this session. He is aware it is the responsibility of the patient to secure confidentiality on his end of the session. The provider was in a private home office for the duration of this session. Limits of confidentiality were discussed with the patient.   Guardian/Payee:  NA    Paperwork requested: No   Reason for Visit /Presenting Problem: Anxiety  Mental Status Exam: Appearance:   Well Groomed     Behavior:  Appropriate  Motor:  Normal  Speech/Language:   Clear and Coherent  Affect:  Appropriate  Mood:  normal  Thought process:  normal  Thought content:    WNL  Sensory/Perceptual disturbances:    WNL  Orientation:  oriented to person, place, and time/date  Attention:  Good  Concentration:  Good  Memory:  WNL  Fund of knowledge:   Good  Insight:    Fair  Judgment:   Fair  Impulse Control:  Good     Reported Symptoms:  The patient endorsed experiencing the following: racing thoughts, feeling on edge, feeling restless, difficulty controlling different worries, multiple different worries, shortness of breath, heart palpitations, and tight muscles. He denied suicidal and homicidal ideation.   Risk Assessment: Danger to Self:  No Self-injurious Behavior: No Danger to Others: No Duty to Warn:no Physical Aggression / Violence:No  Access to Firearms a concern: No  Gang Involvement:No  Patient / guardian was educated about steps to take if suicide or homicide risk level increases between visits: n/a While future psychiatric events cannot be accurately predicted, the patient does not currently require  acute inpatient psychiatric care and does not currently meet [facility] involuntary commitment criteria.  Substance Abuse History: Current substance abuse: No     Past Psychiatric History:   No previous psychological problems have been observed Outpatient Providers:NA History of Psych Hospitalization: No  Psychological Testing:  NA    Abuse History:  Victim of: No.,  NA    Report needed: No. Victim of Neglect:No. Perpetrator of  NA   Witness / Exposure to Domestic Violence: No   Protective Services Involvement: No  Witness to [employer] Violence:  No   Family History: No family history on file.  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   Financial planner: No   Educational History: Education: student He is working towards his associates degree in business.   Religion/Sprituality/World View: Patient described himself as [place].   Any cultural differences that may affect / interfere with treatment:  not applicable   Recreation/Hobbies: music  Stressors: Other: Multiple stressors    Strengths: Supportive Relationships  Barriers:  NA   Legal History: Pending legal issue / charges: The patient has no significant history of legal issues. History of legal issue / charges:  NA  Medical History/Surgical History: reviewed No past medical history on file.  No past surgical history on file.  Medications: Current Outpatient Medications  Medication Sig Dispense Refill   hydrOXYzine (VISTARIL) 50 MG capsule Take 1 capsule (50 mg total) by mouth 3 (three) times daily as needed for anxiety. 30 capsule 0   No current facility-administered medications for this visit.    No Known  Allergies  Diagnoses:  F41.1 generalized anxiety disorder   Plan of Care: The patient is a 21 year old Black male who was referred for counseling due to experiencing anxiety based symptoms. The patient lives at home with his mother, younger brother and sister, and a dog. The patient meets criteria for a diagnosis of F41.1 generalized anxiety disorder based off of the following: racing thoughts, feeling on edge, feeling restless, difficulty controlling different worries, multiple different worries, shortness of breath, heart palpitations, and tight muscles. He denied suicidal and homicidal ideation.   The patient stated that he wanted coping skills  This psychologist makes the recommendation that the patient participate in counseling at least once a month and if possible maybe bi-weekly.    Hilbert Corrigan, PsyD

## 2021-09-27 NOTE — Progress Notes (Signed)
° °  Subjective:    Patient ID: Taylor Carr, male    DOB: 2000/12/04, 21 y.o.   MRN: [id number]  HPI Here due to painful urination  Having some mild discomfort with peeing Slight sting or burn Started yesterday  No fever No discharge  Last sex a couple of weeks ago Past relationship has ended Did use condom  Current Outpatient Medications on File Prior to Visit  Medication Sig Dispense Refill   hydrOXYzine (VISTARIL) 50 MG capsule Take 1 capsule (50 mg total) by mouth 3 (three) times daily as needed for anxiety. (Patient not taking: Reported on 09/27/2021) 30 capsule 0   No current facility-administered medications on file prior to visit.    No Known Allergies  History reviewed. No pertinent past medical history.  History reviewed. No pertinent surgical history.  History reviewed. No pertinent family history.  Social History   Socioeconomic History   Marital status: Single    Spouse name: Not on file   Number of children: Not on file   Years of education: Not on file   Highest education level: Not on file  Occupational History   Not on file  Tobacco Use   Smoking status: Never    Passive exposure: Never   Smokeless tobacco: Never  Substance and Sexual Activity   Alcohol use: No   Drug use: No   Sexual activity: Not on file  Other Topics Concern   Not on file  Social History Narrative   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   Lives with mom, dad, brother, sister.   Enjoys playing basketball.   Social Determinants of Health   Financial Resource Strain: Not on file  Food Insecurity: Not on file  Transportation Needs: Not on file  Physical Activity: Not on file  Stress: Not on file  Social Connections: Not on file  Intimate Partner Violence: Not on file   Review of Systems No N/V No fever Has had increased anxiety---has had some palpitations (not new)    Objective:   Physical Exam Constitutional:      Appearance: Normal appearance.  Genitourinary:     Penis: Normal.      Testes: Normal.     Comments: Normal urethra Neurological:     Mental Status: He is alert.           Assessment & Plan:

## 2021-09-30 LAB — C. TRACHOMATIS/N. GONORRHOEAE RNA
C. trachomatis RNA, TMA: DETECTED — AB
N. gonorrhoeae RNA, TMA: DETECTED — AB

## 2021-10-01 ENCOUNTER — Encounter: Payer: Self-pay | Admitting: Internal Medicine

## 2021-10-01 NOTE — Telephone Encounter (Signed)
Hi Shannon, this patient was evaluated by Dr. Alphonsus Sias. Please try to direct my chart messages to the treating provider, especially since I receive the majority of messages in our office.   I will respond to the patient.  Thanks!

## 2021-10-02 NOTE — Telephone Encounter (Signed)
My mistake. I meant to select Dr Silvio Pate and hit PCP button instead. ?

## 2021-10-09 NOTE — Telephone Encounter (Signed)
Noted, I will be sure that Antonieta Loveless is copied on this to review with Access Nurse if he hasn't already.  ? ?Thanks.  ?

## 2021-10-10 NOTE — Progress Notes (Signed)
? ?New Outpatient Visit ?Date: 10/11/2021 ? ?Referring Provider: ?Mort Sawyers, FNP ?[address] ?[address] ?[place],  [place] [address] ? ?Chief Complaint: Elevated heart rates and panic attacks ? ?HPI:  Taylor Carr is a 21 y.o. male who is being seen today for the evaluation of palpitations and lightheadedness at the request of Mort Sawyers, FNP. He has no significant past medical history.  Beginning in January of this year, he started to notice episodic racing of the heart that was accompanied by a feeling of anxiety.  First episode occurred because he was nervous about a sore throat that he developed.  He has had several more episodes happen since then that typically start with a sense of panic followed by a rise in his heart rate.  He notes associated shortness of breath and mild chest tightness.  He notes that his mother has experienced similar episodes in the past with her panic attacks.  He was prescribed hydroxyzine but has not taken this yet out of concern for potential side effects. ? ?Taylor Carr has not been exercising as much over the last few months but does not feel any significant shortness of breath nor chest pain, palpitations, or lightheadedness when he exerts himself.  He denies edema.  He previously consumed red bowls to give him energy though he has switched to coffee over the last couple of months.  He currently drinks 1 caffeinated coffee a week on average.  He was recently diagnosed with gonorrhea and chlamydia and just finished a course of antibiotics with some resultant constipation. ? ?-------------------------------------------------------------------------------------------------- ? ?Cardiovascular History & Procedures: ?Cardiovascular Problems: ?Palpitations and tachycardia ? ?Risk Factors: ?None ? ?Cath/PCI: ?None ? ?CV Surgery: ?None ? ?EP Procedures and Devices: ?None ? ?Non-Invasive Evaluation(s): ?None ? ?Recent CV Pertinent Labs: ?Lab Results  ?Component Value Date  ? K 4.2 09/06/2021   ? BUN 17 09/06/2021  ? CREATININE 0.93 09/06/2021  ? ? ?-------------------------------------------------------------------------------------------------- ? ?Past Medical History:  ?Diagnosis Date  ? Nasal fracture   ? ? ?Past Surgical History:  ?Procedure Laterality Date  ? NASAL FRACTURE SURGERY  11/2017  ? ? ?Current Meds  ?Medication Sig  ? hydrOXYzine (ATARAX) 50 MG tablet Take 50 mg by mouth 3 (three) times daily as needed for anxiety.  ? ? ?Allergies: Patient has no known allergies. ? ?Social History  ? ?Tobacco Use  ? Smoking status: Never  ?  Passive exposure: Never  ? Smokeless tobacco: Never  ?Vaping Use  ? Vaping Use: Former  ? Devices: tried in the past  ?Substance Use Topics  ? Alcohol use: Yes  ?  Comment: socially - varies, at most on weekends  ? Drug use: No  ? ? ?Family History  ?Problem Relation Age of Onset  ? Hyperthyroidism Mother   ? Panic disorder Mother   ? Heart Problems Mother   ?     sinus tachycardia  ? Hypertension Father   ? ? ?Review of Systems: ?A 12-system review of systems was performed and was negative except as noted in the HPI. ? ?-------------------------------------------------------------------------------------------------- ? ?Physical Exam: ?BP 130/84 (BP Location: Right Arm, Patient Position: Sitting, Cuff Size: Normal)   Pulse (!) 106   Ht 5\' 8"  (1.727 m)   Wt 140 lb (63.5 kg)   SpO2 99%   BMI 21.29 kg/m?  ? ?General: NAD. ?HEENT: No conjunctival pallor or scleral icterus. Facemask in place. ?Neck: Supple without lymphadenopathy, thyromegaly, JVD, or HJR. No carotid bruit. ?Lungs: Normal work of breathing. Clear  to auscultation bilaterally without wheezes or crackles. ?Heart: Tachycardic but regular without murmurs, rubs, or gallops. Non-displaced PMI. ?Abd: Bowel sounds present. Soft, NT/ND without hepatosplenomegaly ?Ext: No lower extremity edema. Radial, PT, and DP pulses are 2+ bilaterally ?Skin: Warm and dry without rash. ?Neuro: CNIII-XII intact. Strength and  fine-touch sensation intact in upper and lower extremities bilaterally. ?Psych: Normal mood and affect. ? ?EKG: Sinus tachycardia.  Otherwise, no abnormality. ? ?Lab Results  ?Component Value Date  ? WBC 7.1 09/06/2021  ? HGB 15.6 09/06/2021  ? HCT 45.9 09/06/2021  ? MCV 84.4 09/06/2021  ? PLT 312.0 09/06/2021  ? ? ?Lab Results  ?Component Value Date  ? NA 137 09/06/2021  ? K 4.2 09/06/2021  ? CL 100 09/06/2021  ? CO2 32 09/06/2021  ? BUN 17 09/06/2021  ? CREATININE 0.93 09/06/2021  ? GLUCOSE 78 09/06/2021  ? ALT 22 09/06/2021  ? ? ?No results found for: CHOL, HDL, LDLCALC, LDLDIRECT, TRIG, CHOLHDL ? ?Lab Results  ?Component Value Date  ? TSH 0.76 09/06/2021  ? ?-------------------------------------------------------------------------------------------------- ? ?ASSESSMENT AND PLAN: ?Sinus tachycardia and palpitations: ?Elevated heart rate could be due to a number of factors including underlying anxiety, caffeine consumption, or other physiologic process.  Recent GC/chlamydia could also be contributing, though palpitations and transient elevations in heart rate seem to predate this.  Physical examination today is unremarkable.  EKG is also normal other than mild sinus tachycardia.  We discussed the utility of additional testing, including ambulatory cardiac monitoring and echocardiography.  My suspicion for underlying structural heart disease is low, and we have agreed to defer imaging at this time.  Taylor Carr wishes to think about ambulatory cardiac monitoring and will contact us if he wishes to proceed.  Recent labs, including chemistries and thyroid studies, were unrevealing.  I encouraged Taylor Carr to stay well-hydrated and to minimize his caffeine consumption.  He will keep a journal of his symptoms over the next couple of months to help [place] better understand the frequency and progression of his anxiety and palpitations.  Continue management of anxiety per his PCP. ? ?Follow-up: Return to clinic in 3  months. ? ?Yvonne Kendall, MD ?10/12/2021 ?5:24 PM ? ?

## 2021-10-11 ENCOUNTER — Ambulatory Visit: Payer: BC Managed Care – PPO | Admitting: Internal Medicine

## 2021-10-11 ENCOUNTER — Encounter: Payer: Self-pay | Admitting: Internal Medicine

## 2021-10-11 ENCOUNTER — Other Ambulatory Visit: Payer: Self-pay

## 2021-10-11 VITALS — BP 130/84 | HR 106 | Ht 68.0 in | Wt 140.0 lb

## 2021-10-11 DIAGNOSIS — R002 Palpitations: Secondary | ICD-10-CM | POA: Diagnosis not present

## 2021-10-11 DIAGNOSIS — R000 Tachycardia, unspecified: Secondary | ICD-10-CM | POA: Diagnosis not present

## 2021-10-11 NOTE — Patient Instructions (Signed)
Medication Instructions:  ? ?Your physician recommends that you continue on your current medications as directed. Please refer to the Current Medication list given to you today. ? ?*If you need a refill on your cardiac medications before your next appointment, please call your pharmacy* ? ? ?Lab Work: ? ?None ordered ? ?Testing/Procedures: ? ?None ordered today.  ? ?Call our office if you would like to proceed with wearing a heart monitor. ? ? ?Follow-Up: ?At [facility], you and your health needs are our priority.  As part of our continuing mission to provide you with exceptional heart care, we have created designated Provider Care Teams.  These Care Teams include your primary Cardiologist (physician) and Advanced Practice Providers (APPs -  Physician Assistants and Nurse Practitioners) who all work together to provide you with the care you need, when you need it. ? ?We recommend signing up for the patient portal called "MyChart".  Sign up information is provided on this After Visit Summary.  MyChart is used to connect with patients for Virtual Visits (Telemedicine).  Patients are able to view lab/test results, encounter notes, upcoming appointments, etc.  Non-urgent messages can be sent to your provider as well.   ?To learn more about what you can do with MyChart, go to [email or web address].   ? ?Your next appointment:   ?3 month(s) ? ?The format for your next appointment:   ?In Person ? ?Provider:   ?You may see Dr. Cristal Deer End or one of the following Advanced Practice Providers on your designated Care Team:   ?Nicolasa Ducking, NP ?Eula Listen, PA-C ?Cadence Fransico Michael, PA-C ?

## 2021-10-12 ENCOUNTER — Encounter: Payer: Self-pay | Admitting: Internal Medicine

## 2021-10-12 DIAGNOSIS — R002 Palpitations: Secondary | ICD-10-CM | POA: Insufficient documentation

## 2021-10-22 ENCOUNTER — Ambulatory Visit: Payer: BC Managed Care – PPO | Admitting: Psychologist

## 2021-10-31 NOTE — Telephone Encounter (Signed)
Patient moved no further acton needed.  ?

## 2021-10-31 NOTE — Telephone Encounter (Signed)
Joellen, please schedule patient in the 3:40 pm slot for 11/01/21. See other message regarding the person that is currently occupying that slot. ?

## 2021-11-01 ENCOUNTER — Encounter: Payer: Self-pay | Admitting: Primary Care

## 2021-11-01 ENCOUNTER — Ambulatory Visit: Payer: BC Managed Care – PPO | Admitting: Primary Care

## 2021-11-01 DIAGNOSIS — R9431 Abnormal electrocardiogram [ECG] [EKG]: Secondary | ICD-10-CM

## 2021-11-01 DIAGNOSIS — F411 Generalized anxiety disorder: Secondary | ICD-10-CM | POA: Diagnosis not present

## 2021-11-01 DIAGNOSIS — R0981 Nasal congestion: Secondary | ICD-10-CM | POA: Insufficient documentation

## 2021-11-01 MED ORDER — BUSPIRONE HCL 5 MG PO TABS: 5.0000 mg | ORAL_TABLET | Freq: Two times a day (BID) | ORAL | 0 refills | Status: DC

## 2021-11-01 NOTE — Progress Notes (Signed)
? ?Subjective:  ? ? Patient ID: Taylor Carr, male    DOB: 12-29-2000, 21 y.o.   MRN: [id number] ? ?HPI ? ?Taylor Carr is a very pleasant 21 y.o. male with a history of anxiety, palpitations, sinus tachycardia who presents today to discuss nasal pain and anxiety. ? ?Evaluated by Lawerance Bach NP on 09/06/21 for symptoms of anxiety with panic attacks. Symptoms included feeling feeling anxious, left-sided chest tightness, shortness of breath with difficulty breathing, worrying about his career and next steps. During this visit he endorsed a lot of transitions and stress over the last 3 to 4 months as his parents divorced and started dating new people.  He also was upset over the end of the 4-year relationship. He was prescribed hydroxozyine 50 mg to use PRN and referred him to psychology. He was also referred to cardiology regarding abnormal ECG with palpitations.  Labs were grossly negative. ? ?Evaluated by cardiology on 10/11/2021.  During this visit he endorsed recent diagnosis of gonorrhea and chlamydia. Cardiology suspected this  to be contributing to some of his palpitations and elevations in heart rate.  Suspicion for underlying heart disease was low. He was advised to return in 3 months. ? ?Over the last two weeks he's felt well overall, was able to get back into the gym. He denies palpitations, shortness of breath, and chest tightness in two weeks. He feels as though he's gained control over his anxiety. He's not taken hydroxyzine. He did take one of his mother's buspirone tablets with improvement in anxiety. He has met with his therapist once, hasn't rescheduled his last appointment. ? ?He's mostly concerned about tightness and pressure to the bridge of his nose, this began about 2 months ago. Originally intermittent, now more consistent. He feels dryness. He denies rhinorrhea, post nasal drip, cough, fevers.  ? ? ? ?Review of Systems  ?Constitutional:  Negative for fever.  ?HENT:  Negative for congestion, ear  pain, postnasal drip, sinus pressure and sore throat.   ?Respiratory:  Negative for cough and shortness of breath.   ? ?   ? ? ?Past Medical History:  ?Diagnosis Date  ? Nasal fracture   ? ? ?Social History  ? ?Socioeconomic History  ? Marital status: Single  ?  Spouse name: Not on file  ? Number of children: Not on file  ? Years of education: Not on file  ? Highest education level: Not on file  ?Occupational History  ? Not on file  ?Tobacco Use  ? Smoking status: Never  ?  Passive exposure: Never  ? Smokeless tobacco: Never  ?Vaping Use  ? Vaping Use: Former  ? Devices: tried in the past  ?Substance and Sexual Activity  ? Alcohol use: Yes  ?  Comment: socially - varies, at most on weekends  ? Drug use: No  ? Sexual activity: Not on file  ?Other Topics Concern  ? Not on file  ?Social History Narrative  ? 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  ? Lives with mom, dad, brother, sister.  ? Enjoys playing basketball.  ? ?Social Determinants of Health  ? ?Financial Resource Strain: Not on file  ?Food Insecurity: Not on file  ?Transportation Needs: Not on file  ?Physical Activity: Not on file  ?Stress: Not on file  ?Social Connections: Not on file  ?Intimate Partner Violence: Not on file  ? ? ?Past Surgical History:  ?Procedure Laterality Date  ? NASAL FRACTURE SURGERY  11/2017  ? ? ?Family History  ?Problem Relation Age of Onset  ?  Hyperthyroidism Mother   ? Panic disorder Mother   ? Heart Problems Mother   ?     sinus tachycardia  ? Hypertension Father   ? ? ?No Known Allergies ? ?No current outpatient medications on file prior to visit.  ? ?No current facility-administered medications on file prior to visit.  ? ? ?BP 122/78   Pulse (!) 103   Ht 5' 8"  (1.727 m)   Wt 144 lb 12.8 oz (65.7 kg)   SpO2 99%   BMI 22.02 kg/m?  ?Objective:  ? Physical Exam ?Cardiovascular:  ?   Rate and Rhythm: Normal rate and regular rhythm.  ?Pulmonary:  ?   Effort: Pulmonary effort is normal.  ?   Breath sounds: Normal breath sounds. No  wheezing or rales.  ?Musculoskeletal:  ?   Cervical back: Neck supple.  ?Skin: ?   General: Skin is warm and dry.  ?Neurological:  ?   Mental Status: He is alert and oriented to person, place, and time.  ?Psychiatric:     ?   Mood and Affect: Mood normal.  ? ? ? ? ? ?   ?Assessment & Plan:  ? ? ? ? ?This visit occurred during the SARS-CoV-2 public health emergency.  Safety protocols were in place, including screening questions prior to the visit, additional usage of staff PPE, and extensive cleaning of exam room while observing appropriate contact time as indicated for disinfecting solutions.  ?

## 2021-11-01 NOTE — Patient Instructions (Signed)
You may take buspirone (BuSpar) 5 mg twice daily as needed. ? ?Schedule follow-up visit with your therapist. ? ?It was a pleasure to see you today! ? ?

## 2021-11-01 NOTE — Assessment & Plan Note (Addendum)
Seems to be improving, do suspect a lot of his symptoms are secondary to anxiety. ? ?Discontinued hydroxyzine. ? ?Prescription for buspirone 5 mg twice daily as needed sent to pharmacy.  He will update if he finds himself using buspirone more consistently.  Discussed that it was okay if he took buspirone on a daily basis. ?

## 2021-11-01 NOTE — Telephone Encounter (Signed)
Patient called have addressed with patient have moved up.  ?

## 2021-11-01 NOTE — Assessment & Plan Note (Signed)
Recommended Flonase as needed. ? ? ?

## 2021-11-01 NOTE — Assessment & Plan Note (Signed)
Evaluated by cardiology, office notes reviewed from March 2023. ? ?Consensus is that his symptoms and EKG are not cardiac related. ?

## 2021-11-15 ENCOUNTER — Other Ambulatory Visit: Payer: Self-pay | Admitting: Primary Care

## 2021-11-15 DIAGNOSIS — F411 Generalized anxiety disorder: Secondary | ICD-10-CM

## 2022-01-20 ENCOUNTER — Encounter: Payer: Self-pay | Admitting: Internal Medicine

## 2022-01-30 ENCOUNTER — Ambulatory Visit: Payer: BC Managed Care – PPO | Admitting: Internal Medicine

## 2022-01-30 NOTE — Progress Notes (Deleted)
   Follow-up Outpatient Visit Date: 01/30/2022  Primary Care Provider: Pleas Koch, NP [place] [address]  Chief Complaint: ***  HPI:  Taylor Carr is a 21 y.o. male with history of anxiety, who presents for follow-up of palpitations and lightheadedness.  I met him in March for evaluation of episodic racing of the heart accompanied by a feeling of anxiety.  He reported associated shortness of breath and chest tightness.  EKG showed sinus tachycardia but no other abnormalities.  He had recently been diagnosed and treated for gonorrhea and chlamydia, likely exacerbating his anxiety.  We discussed ambulatory cardiac monitoring, though Taylor Carr elected to defer this.  --------------------------------------------------------------------------------------------------  Cardiovascular History & Procedures: Cardiovascular Problems: Palpitations and tachycardia   Risk Factors: None   Cath/PCI: None   CV Surgery: None   EP Procedures and Devices: None   Non-Invasive Evaluation(s): None  Recent CV Pertinent Labs: Lab Results  Component Value Date   K 4.2 09/06/2021   BUN 17 09/06/2021   CREATININE 0.93 09/06/2021    Past medical and surgical history were reviewed and updated in EPIC.  No outpatient medications have been marked as taking for the 01/30/22 encounter (Appointment) with Fin Hupp, Harrell Gave, MD.    Allergies: Patient has no known allergies.  Social History   Tobacco Use   Smoking status: Never    Passive exposure: Never   Smokeless tobacco: Never  Vaping Use   Vaping Use: Former   Devices: tried in the past  Substance Use Topics   Alcohol use: Yes    Comment: socially - varies, at most on weekends   Drug use: No    Family History  Problem Relation Age of Onset   Hyperthyroidism Mother    Panic disorder Mother    Heart Problems Mother        sinus tachycardia   Hypertension Father     Review of Systems: A 12-system review of  systems was performed and was negative except as noted in the HPI.  --------------------------------------------------------------------------------------------------  Physical Exam: There were no vitals taken for this visit.  General:  NAD. Neck: No JVD or HJR. Lungs: Clear to auscultation bilaterally without wheezes or crackles. Heart: Regular rate and rhythm without murmurs, rubs, or gallops. Abdomen: Soft, nontender, nondistended. Extremities: No lower extremity edema.  EKG:  ***  Lab Results  Component Value Date   WBC 7.1 09/06/2021   HGB 15.6 09/06/2021   HCT 45.9 09/06/2021   MCV 84.4 09/06/2021   PLT 312.0 09/06/2021    Lab Results  Component Value Date   NA 137 09/06/2021   K 4.2 09/06/2021   CL 100 09/06/2021   CO2 32 09/06/2021   BUN 17 09/06/2021   CREATININE 0.93 09/06/2021   GLUCOSE 78 09/06/2021   ALT 22 09/06/2021    No results found for: "CHOL", "HDL", "LDLCALC", "LDLDIRECT", "TRIG", "CHOLHDL"  --------------------------------------------------------------------------------------------------  ASSESSMENT AND PLAN: ***  Nelva Bush, MD 01/30/2022 7:02 AM

## 2022-04-29 ENCOUNTER — Other Ambulatory Visit: Payer: Self-pay | Admitting: Primary Care

## 2022-04-29 DIAGNOSIS — F411 Generalized anxiety disorder: Secondary | ICD-10-CM

## 2022-05-01 ENCOUNTER — Encounter: Payer: Self-pay | Admitting: Primary Care

## 2022-05-01 ENCOUNTER — Ambulatory Visit (INDEPENDENT_AMBULATORY_CARE_PROVIDER_SITE_OTHER): Payer: BC Managed Care – PPO | Admitting: Primary Care

## 2022-05-01 VITALS — BP 130/60 | HR 82 | Temp 98.1°F | Ht 68.0 in | Wt 140.0 lb

## 2022-05-01 DIAGNOSIS — Z114 Encounter for screening for human immunodeficiency virus [HIV]: Secondary | ICD-10-CM

## 2022-05-01 DIAGNOSIS — F411 Generalized anxiety disorder: Secondary | ICD-10-CM

## 2022-05-01 DIAGNOSIS — R5383 Other fatigue: Secondary | ICD-10-CM

## 2022-05-01 DIAGNOSIS — Z0001 Encounter for general adult medical examination with abnormal findings: Secondary | ICD-10-CM | POA: Diagnosis not present

## 2022-05-01 DIAGNOSIS — B360 Pityriasis versicolor: Secondary | ICD-10-CM

## 2022-05-01 DIAGNOSIS — Z1159 Encounter for screening for other viral diseases: Secondary | ICD-10-CM

## 2022-05-01 LAB — COMPREHENSIVE METABOLIC PANEL
ALT: 22 U/L (ref 0–53)
AST: 23 U/L (ref 0–37)
Albumin: 5 g/dL (ref 3.5–5.2)
Alkaline Phosphatase: 61 U/L (ref 39–117)
BUN: 14 mg/dL (ref 6–23)
CO2: 29 mEq/L (ref 19–32)
Calcium: 10.2 mg/dL (ref 8.4–10.5)
Chloride: 104 mEq/L (ref 96–112)
Creatinine, Ser: 1.06 mg/dL (ref 0.40–1.50)
GFR: 99.94 mL/min (ref >60.00)
Glucose, Bld: 62 mg/dL — ABNORMAL LOW (ref 70–99)
Potassium: 3.9 mEq/L (ref 3.5–5.1)
Sodium: 141 mEq/L (ref 135–145)
Total Bilirubin: 0.6 mg/dL (ref 0.2–1.2)
Total Protein: 7.7 g/dL (ref 6.0–8.3)

## 2022-05-01 LAB — LIPID PANEL
Cholesterol: 155 mg/dL (ref 0–200)
HDL: 64.2 mg/dL (ref >39.00)
LDL Cholesterol: 77 mg/dL (ref 0–99)
NonHDL: 90.62
Total CHOL/HDL Ratio: 2
Triglycerides: 70 mg/dL (ref 0.0–149.0)
VLDL: 14 mg/dL (ref 0.0–40.0)

## 2022-05-01 LAB — CBC
HCT: 44.3 % (ref 39.0–52.0)
Hemoglobin: 15.4 g/dL (ref 13.0–17.0)
MCHC: 34.7 g/dL (ref 30.0–36.0)
MCV: 84.9 fl (ref 78.0–100.0)
Platelets: 281 10*3/uL (ref 150.0–400.0)
RBC: 5.22 Mil/uL (ref 4.22–5.81)
RDW: 13.7 % (ref 11.5–15.5)
WBC: 4.1 10*3/uL (ref 4.0–10.5)

## 2022-05-01 LAB — TSH: TSH: 0.62 u[IU]/mL (ref 0.35–5.50)

## 2022-05-01 LAB — T4, FREE: Free T4: 0.84 ng/dL (ref 0.60–1.60)

## 2022-05-01 LAB — VITAMIN B12: Vitamin B-12: 677 pg/mL (ref 211–911)

## 2022-05-01 NOTE — Assessment & Plan Note (Addendum)
Active and intermittent.   Long discussion today regarding physical symptoms of anxiety as it seems like he has several. Suspect fatigue is a combination of anxiety and his evening activities before going to bed. We discussed good bedtime regimen/routine.  Encouraged regular exercise.  Discussed to start buspirone 5 mg 1-2 times daily if needed.

## 2022-05-01 NOTE — Progress Notes (Signed)
Subjective:    Patient ID: Taylor Carr, male    DOB: 07/12/2001, 21 y.o.   MRN: [id number]  HPI  Taylor Carr is a very pleasant 21 y.o. male who presents today for complete physical and follow up of chronic conditions.  He would also like to discuss fatigue. His fatigue occurs daily, around 2p-4p during the day. He denies difficulty falling or staying asleep, but he feels that he must be productive once he gets home from work. 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 He will get to bed around midnight, sometimes wakes up at 4 am feeling alert. He sleeps from 5-8 hours on average. Mostly wakes up feeling well rested if he's had 6 hours of sleep. He sometimes feels very tired at work like he could fall asleep.   He began to notice increased anxiety in August 2023 with symptoms of worrying, especially about his physical health. He will notice shortness of breath, throat tightness, chest tightness that occurs only with anxiety. Also with difficulty relaxing, muscle tension. During his last visit he was prescribed buspirone to use for anxiety but he never started. He has yet to take busprione as he's able to manage his anxiety with non pharmacological techniques.   He's not been as physically active over the last few months, endorses an unhealthier diet. He is working to get back into regular exercise. He's also made a new friend and has been spending time with her.   Immunizations: -Tetanus: 2023 -Influenza:   -HPV: Completed course  Diet: Fair diet.  Exercise: No regular exercise.  Eye exam: Completes annually  Dental exam: Completes semi-annually   BP Readings from Last 3 Encounters:  05/01/22 130/60  11/01/21 122/78  10/11/21 130/84         Review of Systems  Constitutional:  Negative for unexpected weight change.  HENT:  Negative for rhinorrhea.   Respiratory:  Negative for cough and shortness of breath.    Cardiovascular:  Negative for chest pain.  Gastrointestinal:  Negative for constipation and diarrhea.  Genitourinary:  Negative for difficulty urinating.  Musculoskeletal:  Negative for arthralgias and myalgias.  Skin:  Negative for rash.  Allergic/Immunologic: Negative for environmental allergies.  Neurological:  Negative for dizziness and headaches.  Psychiatric/Behavioral:  Negative for sleep disturbance. The patient is nervous/anxious.        See HPI         Past Medical History:  Diagnosis Date   Nasal fracture     Social History   Socioeconomic History   Marital status: Single    Spouse name: Not on file   Number of children: Not on file   Years of education: Not on file   Highest education level: Not on file  Occupational History   Not on file  Tobacco Use   Smoking status: Never    Passive exposure: Never   Smokeless tobacco: Never  Vaping Use   Vaping Use: Former   Devices: tried in the past  Substance and Sexual Activity   Alcohol use: Yes    Comment: socially - varies, at most on weekends   Drug use: No   Sexual activity: Not on file  Other Topics Concern   Not on file  Social History Narrative   Ship broker, 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   Lives with mom, dad, brother, sister.   Enjoys playing basketball.   Social Determinants of Health   Financial  Resource Strain: Not on file  Food Insecurity: Not on file  Transportation Needs: Not on file  Physical Activity: Not on file  Stress: Not on file  Social Connections: Not on file  Intimate Partner Violence: Not on file    Past Surgical History:  Procedure Laterality Date   NASAL FRACTURE SURGERY  11/2017    Family History  Problem Relation Age of Onset   Hyperthyroidism Mother    Panic disorder Mother    Heart Problems Mother        sinus tachycardia   Hypertension Father     No Known Allergies  Current Outpatient Medications on File Prior to Visit  Medication Sig Dispense Refill   busPIRone  (BUSPAR) 5 MG tablet TAKE 1 TABLET (5 MG TOTAL) BY MOUTH 2 (TWO) TIMES DAILY. FOR ANXIETY. (Patient not taking: Reported on 05/01/2022) 180 tablet 0   No current facility-administered medications on file prior to visit.    BP 130/60   Pulse 82   Temp 98.1 F (36.7 C) (Temporal)   Ht 5\' 8"  (1.727 m)   Wt 140 lb (63.5 kg)   SpO2 98%   BMI 21.29 kg/m  Objective:   Physical Exam HENT:     Right Ear: Tympanic membrane and ear canal normal.     Left Ear: Tympanic membrane and ear canal normal.     Nose: Nose normal.     Right Sinus: No maxillary sinus tenderness or frontal sinus tenderness.     Left Sinus: No maxillary sinus tenderness or frontal sinus tenderness.  Eyes:     Conjunctiva/sclera: Conjunctivae normal.  Neck:     Thyroid: No thyromegaly.     Vascular: No carotid bruit.  Cardiovascular:     Rate and Rhythm: Normal rate and regular rhythm.     Heart sounds: Normal heart sounds.  Pulmonary:     Effort: Pulmonary effort is normal.     Breath sounds: Normal breath sounds. No wheezing or rales.  Abdominal:     General: Bowel sounds are normal.     Palpations: Abdomen is soft.     Tenderness: There is no abdominal tenderness.  Musculoskeletal:        General: Normal range of motion.     Cervical back: Neck supple.  Skin:    General: Skin is warm and dry.  Neurological:     Mental Status: He is alert and oriented to person, place, and time.     Cranial Nerves: No cranial nerve deficit.     Deep Tendon Reflexes: Reflexes are normal and symmetric.  Psychiatric:        Mood and Affect: Mood normal.           Assessment & Plan:   Problem List Items Addressed This Visit       Musculoskeletal and Integument   Tinea versicolor    Stable. No concerns today        Other   Encounter for annual general medical examination with abnormal findings in adult - Primary   GAD (generalized anxiety disorder)    Active and intermittent.   Long discussion today  regarding physical symptoms of anxiety as it seems like he has several. Suspect fatigue is a combination of anxiety and his evening activities before going to bed. We discussed good bedtime regimen/routine.  Encouraged regular exercise.  Discussed to start buspirone 5 mg 1-2 times daily if needed.      Other fatigue    Likely a combination of  anxiety and his evening activities. Discussed options today.  Checking labs to rule out metabolic cause. Will work to help treat anxiety and sleep routine.  Await results.       Relevant Orders   T4, free   TSH   Lipid panel   Comprehensive metabolic panel   CBC   Vitamin B12   Other Visit Diagnoses     Screening for HIV (human immunodeficiency virus)       Relevant Orders   HIV Antibody (routine testing w rflx)   Encounter for hepatitis C screening test for low risk patient       Relevant Orders   Hepatitis C Antibody          Pleas Koch, NP

## 2022-05-01 NOTE — Assessment & Plan Note (Signed)
Likely a combination of anxiety and his evening activities. Discussed options today.  Checking labs to rule out metabolic cause. Will work to help treat anxiety and sleep routine.  Await results.

## 2022-05-01 NOTE — Assessment & Plan Note (Signed)
Stable. ? ?No concerns today. ?

## 2022-05-01 NOTE — Patient Instructions (Signed)
Stop by the lab prior to leaving today. I will notify you of your results once received.   Use the buspirone if needed for anxiety as discussed.  It was a pleasure to see you today!

## 2022-05-02 LAB — HIV ANTIBODY (ROUTINE TESTING W REFLEX): HIV 1&2 Ab, 4th Generation: NONREACTIVE

## 2022-05-02 LAB — HEPATITIS C ANTIBODY: Hepatitis C Ab: NONREACTIVE

## 2022-06-23 ENCOUNTER — Telehealth: Payer: BC Managed Care – PPO | Admitting: Physician Assistant

## 2022-06-23 DIAGNOSIS — J069 Acute upper respiratory infection, unspecified: Secondary | ICD-10-CM | POA: Diagnosis not present

## 2022-06-23 MED ORDER — BENZONATATE 100 MG PO CAPS: 100.0000 mg | ORAL_CAPSULE | Freq: Three times a day (TID) | ORAL | 0 refills | Status: DC | PRN

## 2022-06-23 MED ORDER — FLUTICASONE PROPIONATE 50 MCG/ACT NA SUSP: 2.0000 | Freq: Every day | NASAL | 0 refills | Status: DC

## 2022-06-23 NOTE — Progress Notes (Signed)
E-Visit for Upper Respiratory Infection   We are sorry you are not feeling well.  Here is how we plan to help!  Based on what you have shared with me, it looks like you may have a viral upper respiratory infection.  Upper respiratory infections are caused by a large number of viruses; however, rhinovirus is the most common cause.   Symptoms vary from person to person, with common symptoms including sore throat, cough, fatigue or lack of energy and feeling of general discomfort.  A low-grade fever of up to 100.4 may present, but is often uncommon.  Symptoms vary however, and are closely related to a person's age or underlying illnesses.  The most common symptoms associated with an upper respiratory infection are nasal discharge or congestion, cough, sneezing, headache and pressure in the ears and face.  These symptoms usually persist for about 3 to 10 days, but can last up to 2 weeks.  It is important to know that upper respiratory infections do not cause serious illness or complications in most cases.    Upper respiratory infections can be transmitted from person to person, with the most common method of transmission being a person's hands.  The virus is able to live on the skin and can infect other persons for up to 2 hours after direct contact.  Also, these can be transmitted when someone coughs or sneezes; thus, it is important to cover the mouth to reduce this risk.  To keep the spread of the illness at bay, good hand hygiene is very important.  This is an infection that is most likely caused by a virus. There are no specific treatments other than to help you with the symptoms until the infection runs its course.  We are sorry you are not feeling well.  Here is how we plan to help!   For nasal congestion, you may use an oral decongestants such as Mucinex D or if you have glaucoma or high blood pressure use plain Mucinex.  Saline nasal spray or nasal drops can help and can safely be used as often as  needed for congestion.  For your congestion, I have prescribed Fluticasone nasal spray one spray in each nostril twice a day  If you do not have a history of heart disease, hypertension, diabetes or thyroid disease, prostate/bladder issues or glaucoma, you may also use Sudafed to treat nasal congestion.  It is highly recommended that you consult with a pharmacist or your primary care physician to ensure this medication is safe for you to take.     If you have a cough, you may use cough suppressants such as Delsym and Robitussin.  If you have glaucoma or high blood pressure, you can also use Coricidin HBP.   For cough I have prescribed for you A prescription cough medication called Tessalon Perles 100 mg. You may take 1-2 capsules every 8 hours as needed for cough  If you have a sore or scratchy throat, use a saltwater gargle-  to  teaspoon of salt dissolved in a 4-ounce to 8-ounce glass of warm water.  Gargle the solution for approximately 15-30 seconds and then spit.  It is important not to swallow the solution.  You can also use throat lozenges/cough drops and Chloraseptic spray to help with throat pain or discomfort.  Warm or cold liquids can also be helpful in relieving throat pain.  For headache, pain or general discomfort, you can use Ibuprofen or Tylenol as directed.   Some authorities believe   that zinc sprays or the use of Echinacea may shorten the course of your symptoms.   HOME CARE Only take medications as instructed by your medical team. Be sure to drink plenty of fluids. Water is fine as well as fruit juices, sodas and electrolyte beverages. You may want to stay away from caffeine or alcohol. If you are nauseated, try taking small sips of liquids. How do you know if you are getting enough fluid? Your urine should be a pale yellow or almost colorless. Get rest. Taking a steamy shower or using a humidifier may help nasal congestion and ease sore throat pain. You can place a towel over  your head and breathe in the steam from hot water coming from a faucet. Using a saline nasal spray works much the same way. Cough drops, hard candies and sore throat lozenges may ease your cough. Avoid close contacts especially the very young and the elderly Cover your mouth if you cough or sneeze Always remember to wash your hands.   GET HELP RIGHT AWAY IF: You develop worsening fever. If your symptoms do not improve within 10 days You develop yellow or green discharge from your nose over 3 days. You have coughing fits You develop a severe head ache or visual changes. You develop shortness of breath, difficulty breathing or start having chest pain Your symptoms persist after you have completed your treatment plan  MAKE SURE YOU  Understand these instructions. Will watch your condition. Will get help right away if you are not doing well or get worse.  Thank you for choosing an e-visit.  Your e-visit answers were reviewed by a board certified advanced clinical practitioner to complete your personal care plan. Depending upon the condition, your plan could have included both over the counter or prescription medications.  Please review your pharmacy choice. Make sure the pharmacy is open so you can pick up prescription now. If there is a problem, you may contact your provider through MyChart messaging and have the prescription routed to another pharmacy.  Your safety is important to us. If you have drug allergies check your prescription carefully.   For the next 24 hours you can use MyChart to ask questions about today's visit, request a non-urgent call back, or ask for a work or school excuse. You will get an email in the next two days asking about your experience. I hope that your e-visit has been valuable and will speed your recovery.   I have spent 5 minutes in review of e-visit questionnaire, review and updating patient chart, medical decision making and response to patient.    Rihaan Barrack M Gill Delrossi, PA-C  

## 2022-08-22 DIAGNOSIS — J069 Acute upper respiratory infection, unspecified: Secondary | ICD-10-CM | POA: Diagnosis not present

## 2022-08-22 DIAGNOSIS — Z20822 Contact with and (suspected) exposure to covid-19: Secondary | ICD-10-CM | POA: Diagnosis not present

## 2022-08-22 DIAGNOSIS — R52 Pain, unspecified: Secondary | ICD-10-CM | POA: Diagnosis not present

## 2022-12-11 ENCOUNTER — Ambulatory Visit (INDEPENDENT_AMBULATORY_CARE_PROVIDER_SITE_OTHER): Payer: BC Managed Care – PPO | Admitting: Primary Care

## 2022-12-11 ENCOUNTER — Encounter: Payer: Self-pay | Admitting: Primary Care

## 2022-12-11 VITALS — BP 120/76 | HR 82 | Temp 98.1°F | Ht 68.0 in | Wt 138.0 lb

## 2022-12-11 DIAGNOSIS — R051 Acute cough: Secondary | ICD-10-CM | POA: Insufficient documentation

## 2022-12-11 LAB — POC COVID19 BINAXNOW: SARS Coronavirus 2 Ag: NEGATIVE

## 2022-12-11 LAB — POCT INFLUENZA A/B
Influenza A, POC: NEGATIVE
Influenza B, POC: NEGATIVE

## 2022-12-11 NOTE — Patient Instructions (Signed)
You can try a few things over the counter to help with your symptoms including:  Cough: Delsym or Robitussin (get the off brand, works just as well) Chest Congestion: Mucinex (plain) Nasal Congestion/Ear Pressure/Sinus Pressure: Try using Flonase (fluticasone) nasal spray. Instill 1 spray in each nostril twice daily. This can be purchased over the counter. Body aches, fevers, headache: Ibuprofen (not to exceed 2400 mg in 24 hours) or Acetaminophen-Tylenol (not to exceed 3000 mg in 24 hours) Runny Nose/Throat Drainage/Sneezing/Itchy or Watery Eyes: An antihistamine such as Zyrtec, Claritin, Xyzal, Allegra  You should be feeling better by day seven of symptoms, but please do contact me if this is not the case.  It was a pleasure to see you today!  

## 2022-12-11 NOTE — Progress Notes (Signed)
Subjective:    Patient ID: Taylor Carr, male    DOB: [birth year], 22 y.o.   MRN: [id number]  Headache  Associated symptoms include coughing. Pertinent negatives include no ear pain or fever.    Taylor Carr is a very pleasant 22 y.o. male with a history of palpitations, sinus tachycardia, fatigue, GAD who presents today to discuss URI symptoms.  Symptom onset two days ago with body aches. He then developed headache with pressure around his head, nausea, chills, sweats, one episode of vomiting, and mild cough.   He denies ear pain, fevers, nasal congestion. He has not tested for Covid-19 infection.      Review of Systems  Constitutional:  Positive for chills and fatigue. Negative for fever.  HENT:  Negative for ear pain and postnasal drip.   Respiratory:  Positive for cough.   Musculoskeletal:  Positive for myalgias.  Neurological:  Positive for headaches.         Past Medical History:  Diagnosis Date   Nasal fracture     Social History   Socioeconomic History   Marital status: Single    Spouse name: Not on file   Number of children: Not on file   Years of education: Not on file   Highest education level: Not on file  Occupational History   Not on file  Tobacco Use   Smoking status: Never    Passive exposure: Never   Smokeless tobacco: Never  Vaping Use   Vaping Use: Former   Devices: tried in the past  Substance and Sexual Activity   Alcohol use: Yes    Comment: socially - varies, at most on weekends   Drug use: No   Sexual activity: Not on file  Other Topics Concern   Not on file  Social History Narrative   Consulting civil engineer, 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   Lives with mom, dad, brother, sister.   Enjoys playing basketball.   Social Determinants of Health   Financial Resource Strain: Not on file  Food Insecurity: Not on file  Transportation Needs: Not on file  Physical Activity: Not on file  Stress: Not on file  Social Connections: Not on file  Intimate Partner  Violence: Not on file    Past Surgical History:  Procedure Laterality Date   NASAL FRACTURE SURGERY  11/2017    Family History  Problem Relation Age of Onset   Hyperthyroidism Mother    Panic disorder Mother    Heart Problems Mother        sinus tachycardia   Hypertension Father     No Known Allergies  Current Outpatient Medications on File Prior to Visit  Medication Sig Dispense Refill   busPIRone (BUSPAR) 5 MG tablet TAKE 1 TABLET (5 MG TOTAL) BY MOUTH 2 (TWO) TIMES DAILY. FOR ANXIETY. (Patient not taking: Reported on 05/01/2022) 180 tablet 0   fluticasone (FLONASE) 50 MCG/ACT nasal spray Place 2 sprays into both nostrils daily. (Patient not taking: Reported on 12/11/2022) 16 g 0   No current facility-administered medications on file prior to visit.    BP 120/76   Pulse 82   Temp 98.1 F (36.7 C) (Temporal)   Ht 5\' 8"  (1.727 m)   Wt 138 lb (62.6 kg)   SpO2 98%   BMI 20.98 kg/m  Objective:   Physical Exam Constitutional:      Appearance: He is not ill-appearing.  HENT:     Right Ear: Tympanic membrane and ear canal normal.     Left Ear:  Tympanic membrane and ear canal normal.     Nose: No mucosal edema.     Right Sinus: No maxillary sinus tenderness or frontal sinus tenderness.     Left Sinus: No maxillary sinus tenderness or frontal sinus tenderness.     Mouth/Throat:     Mouth: Mucous membranes are moist.  Eyes:     Conjunctiva/sclera: Conjunctivae normal.  Cardiovascular:     Rate and Rhythm: Normal rate and regular rhythm.  Pulmonary:     Effort: Pulmonary effort is normal.     Breath sounds: Normal breath sounds. No wheezing or rales.  Musculoskeletal:     Cervical back: Neck supple.  Skin:    General: Skin is warm and dry.           Assessment & Plan:  Acute cough Assessment & Plan: Symptoms representative of viral etiology.  Rapid Covid-19 test negative. Rapid influenza test negative.   Discussed conservative care including  Tylenol/Ibuprofen, OTC cough suppressants, Flonase, rest.  Return precautions provided.   Orders: -     POC COVID-19 BinaxNow -     POCT Influenza A/B        Doreene Nest, NP

## 2022-12-11 NOTE — Assessment & Plan Note (Signed)
Symptoms representative of viral etiology.  Rapid Covid-19 test negative. Rapid influenza test negative.   Discussed conservative care including Tylenol/Ibuprofen, OTC cough suppressants, Flonase, rest.  Return precautions provided.

## 2023-01-22 ENCOUNTER — Other Ambulatory Visit (HOSPITAL_COMMUNITY): Payer: Self-pay

## 2023-04-28 ENCOUNTER — Encounter: Payer: Self-pay | Admitting: Physician Assistant

## 2023-04-28 ENCOUNTER — Telehealth: Payer: BC Managed Care – PPO | Admitting: Physician Assistant

## 2023-04-28 DIAGNOSIS — J069 Acute upper respiratory infection, unspecified: Secondary | ICD-10-CM | POA: Diagnosis not present

## 2023-04-28 MED ORDER — FLUTICASONE PROPIONATE 50 MCG/ACT NA SUSP
2.0000 | Freq: Every day | NASAL | 0 refills | Status: AC
Start: 2023-04-28 — End: ?

## 2023-04-28 NOTE — Progress Notes (Signed)

## 2023-04-28 NOTE — Progress Notes (Signed)
I have spent 5 minutes in review of e-visit questionnaire, review and updating patient chart, medical decision making and response to patient.   Mia Milan Cody Jacklynn Dehaas, PA-C    

## 2023-04-29 ENCOUNTER — Encounter: Payer: Self-pay | Admitting: Family Medicine

## 2023-05-02 DIAGNOSIS — F411 Generalized anxiety disorder: Secondary | ICD-10-CM | POA: Diagnosis not present

## 2023-06-08 DIAGNOSIS — F419 Anxiety disorder, unspecified: Secondary | ICD-10-CM | POA: Diagnosis not present

## 2023-06-08 DIAGNOSIS — R0981 Nasal congestion: Secondary | ICD-10-CM | POA: Diagnosis not present

## 2023-08-20 ENCOUNTER — Encounter: Payer: Self-pay | Admitting: Primary Care

## 2023-08-20 ENCOUNTER — Ambulatory Visit: Payer: BC Managed Care – PPO | Admitting: Primary Care

## 2023-08-20 VITALS — BP 142/80 | HR 98 | Temp 98.4°F | Ht 68.0 in | Wt 140.0 lb

## 2023-08-20 DIAGNOSIS — F411 Generalized anxiety disorder: Secondary | ICD-10-CM | POA: Diagnosis not present

## 2023-08-20 DIAGNOSIS — R195 Other fecal abnormalities: Secondary | ICD-10-CM

## 2023-08-20 DIAGNOSIS — Z13220 Encounter for screening for lipoid disorders: Secondary | ICD-10-CM

## 2023-08-20 DIAGNOSIS — R55 Syncope and collapse: Secondary | ICD-10-CM

## 2023-08-20 DIAGNOSIS — Z0001 Encounter for general adult medical examination with abnormal findings: Secondary | ICD-10-CM

## 2023-08-20 DIAGNOSIS — Z0000 Encounter for general adult medical examination without abnormal findings: Secondary | ICD-10-CM | POA: Diagnosis not present

## 2023-08-20 LAB — COMPREHENSIVE METABOLIC PANEL
ALT: 33 U/L (ref 0–53)
AST: 31 U/L (ref 0–37)
Albumin: 5.5 g/dL — ABNORMAL HIGH (ref 3.5–5.2)
Alkaline Phosphatase: 60 U/L (ref 39–117)
BUN: 14 mg/dL (ref 6–23)
CO2: 28 meq/L (ref 19–32)
Calcium: 10.5 mg/dL (ref 8.4–10.5)
Chloride: 100 meq/L (ref 96–112)
Creatinine, Ser: 0.95 mg/dL (ref 0.40–1.50)
GFR: 112.94 mL/min (ref >60.00)
Glucose, Bld: 83 mg/dL (ref 70–99)
Potassium: 4.3 meq/L (ref 3.5–5.1)
Sodium: 137 meq/L (ref 135–145)
Total Bilirubin: 0.7 mg/dL (ref 0.2–1.2)
Total Protein: 8.2 g/dL (ref 6.0–8.3)

## 2023-08-20 LAB — LIPID PANEL
Cholesterol: 195 mg/dL (ref 0–200)
HDL: 72.3 mg/dL (ref >39.00)
LDL Cholesterol: 114 mg/dL — ABNORMAL HIGH (ref 0–99)
NonHDL: 122.95
Total CHOL/HDL Ratio: 3
Triglycerides: 45 mg/dL (ref 0.0–149.0)
VLDL: 9 mg/dL (ref 0.0–40.0)

## 2023-08-20 LAB — CBC
HCT: 48.1 % (ref 39.0–52.0)
Hemoglobin: 16.9 g/dL (ref 13.0–17.0)
MCHC: 35.1 g/dL (ref 30.0–36.0)
MCV: 84 fL (ref 78.0–100.0)
Platelets: 311 10*3/uL (ref 150.0–400.0)
RBC: 5.73 Mil/uL (ref 4.22–5.81)
RDW: 13 % (ref 11.5–15.5)
WBC: 5 10*3/uL (ref 4.0–10.5)

## 2023-08-20 LAB — TSH: TSH: 1.21 u[IU]/mL (ref 0.35–5.50)

## 2023-08-20 LAB — T4, FREE: Free T4: 0.9 ng/dL (ref 0.60–1.60)

## 2023-08-20 MED ORDER — PROPRANOLOL HCL 10 MG PO TABS
10.0000 mg | ORAL_TABLET | Freq: Every day | ORAL | 0 refills | Status: DC | PRN
Start: 2023-08-20 — End: 2024-04-27

## 2023-08-20 NOTE — Assessment & Plan Note (Signed)
Discussed to increase fiber intake, water intake, exercise. We discussed fiber supplements as well.  Low suspicion for alpha gal, food sensitivity.  Referral placed to GI per patient request.

## 2023-08-20 NOTE — Progress Notes (Signed)
Subjective:    Patient ID: Taylor Carr, male    DOB: 2001/02/27, 23 y.o.   MRN: [id number]  HPI  Taylor Carr is a very pleasant 23 y.o. male with a history of GAD, sinus tachycardia, palpitations, fatigue who presents today for complete physical and follow up of chronic conditions.  1) Near Syncope: He would also like to discuss near syncope.  He contacted [place] via MyChart on 08/19/2023 describing an episode of near syncope.  His first episode occurred while he was in a restaurant in early November 2024. He stood up, put his book bag on, felt like everything was "closing in". He felt like he could pass out, leaned on his girlfriend's mom, did not pass out. Then developed diaphoresis and tingling to his fingertips. Paramedics came to the restaurant, BP was 120/80, glucose was checked but he doesn't recall the reading. He denies feeling anxious before the episode, but did feel anxious during the episode. Symptoms resolved after 15 minutes.  The following day he presented to Urgent Care, orthostatic vitals were negative, exam was benign. He's not experienced a near syncopal episode since.   Since then he's felt intermittently anxious throughout the day. Symptoms occur suddenly with various physical symptoms including nasal inflammation, shortness of breath, palpitations, feeling nevous, sometimes sweaty palms.   Chronic history of anxiety, not currently managed on treatment. Previously prescribed buspirone to take as needed for which he took infrequently. He is interested in therapy, is looking into a few now.   2) Changes in Stools: Chronic. About 1-2 years ago he noticed a change in his stools. Typically stools were medium sized, formed, and soft.  Over the last 1 to 2 years he has noticed stools are firm and smaller for the most part.  He denies significant changes in his lifestyle that would cause the changes in his stool.  More recently he has increased intake of fiber, water, exercise.  He  denies blood in the stools, food intolerance, hives.  Immunizations: -Tetanus: Completed in 2023 -Influenza: Will complete at [facility]  Diet: Fair diet.  Exercise: No regular exercise.  Eye exam: Completed years ago, no concerns  Dental exam: Completes semi-annually   BP Readings from Last 3 Encounters:  08/20/23 (!) 142/80  12/11/22 120/76  05/01/22 130/60        Review of Systems  Constitutional:  Negative for unexpected weight change.  HENT:  Negative for rhinorrhea.   Respiratory:  Negative for cough and shortness of breath.   Cardiovascular:  Positive for palpitations. Negative for chest pain.  Gastrointestinal:  Negative for constipation and diarrhea.       Changes in stools, see HPI  Genitourinary:  Negative for difficulty urinating.  Musculoskeletal:  Negative for arthralgias and myalgias.  Skin:  Negative for rash.  Allergic/Immunologic: Negative for environmental allergies.  Neurological:  Negative for dizziness, numbness and headaches.  Psychiatric/Behavioral:  The patient is nervous/anxious.        See HPI         Past Medical History:  Diagnosis Date   Nasal fracture     Social History   Socioeconomic History   Marital status: Single    Spouse name: Not on file   Number of children: Not on file   Years of education: Not on file   Highest education level: Not on file  Occupational History   Not on file  Tobacco Use   Smoking status: Never    Passive exposure: Never   Smokeless tobacco:  Never  Vaping Use   Vaping status: Former   Devices: tried in the past  Substance and Sexual Activity   Alcohol use: Yes    Comment: socially - varies, at most on weekends   Drug use: No   Sexual activity: Not on file  Other Topics Concern   Not on file  Social History Narrative   Consulting civil engineer, 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   Lives with mom, dad, brother, sister.   Enjoys playing basketball.   Social Drivers of Corporate investment banker Strain: Not on file   Food Insecurity: Not on file  Transportation Needs: Not on file  Physical Activity: Not on file  Stress: Not on file  Social Connections: Not on file  Intimate Partner Violence: Not on file    Past Surgical History:  Procedure Laterality Date   NASAL FRACTURE SURGERY  11/2017    Family History  Problem Relation Age of Onset   Hyperthyroidism Mother    Panic disorder Mother    Heart Problems Mother        sinus tachycardia   Hypertension Father     No Known Allergies  Current Outpatient Medications on File Prior to Visit  Medication Sig Dispense Refill   fluticasone (FLONASE) 50 MCG/ACT nasal spray Place 2 sprays into both nostrils daily. 16 g 0   No current facility-administered medications on file prior to visit.    BP (!) 142/80   Pulse 98   Temp 98.4 F (36.9 C) (Temporal)   Ht 5\' 8"  (1.727 m)   Wt 140 lb (63.5 kg)   SpO2 100%   BMI 21.29 kg/m  Objective:   Physical Exam HENT:     Right Ear: Tympanic membrane and ear canal normal.     Left Ear: Tympanic membrane and ear canal normal.  Eyes:     Pupils: Pupils are equal, round, and reactive to light.  Cardiovascular:     Rate and Rhythm: Normal rate and regular rhythm.  Pulmonary:     Effort: Pulmonary effort is normal.     Breath sounds: Normal breath sounds.  Abdominal:     General: Bowel sounds are normal.     Palpations: Abdomen is soft.     Tenderness: There is no abdominal tenderness.  Musculoskeletal:        General: Normal range of motion.     Cervical back: Neck supple.  Skin:    General: Skin is warm and dry.  Neurological:     Mental Status: He is alert and oriented to person, place, and time.     Cranial Nerves: No cranial nerve deficit.     Deep Tendon Reflexes:     Reflex Scores:      Patellar reflexes are 2+ on the right side and 2+ on the left side. Psychiatric:        Mood and Affect: Mood normal.           Assessment & Plan:  Encounter for annual general medical  examination with abnormal findings in adult Assessment & Plan: Immunizations UTD. He will complete influenza vaccine at work.  Discussed the importance of a healthy diet and regular exercise in order for weight loss, and to reduce the risk of further co-morbidity.  Exam stable. Labs pending.  Follow up in 1 year for repeat physical.   Orders: -     Lipid panel  Near syncope Assessment & Plan: Isolated incidence.  Fortunately, he was evaluated by both paramedics and urgent care.  Would include additional workup if symptoms returned.  He agrees and will update if that is the case.  Continue to work on hydration and good diet.  Orders: -     CBC -     Comprehensive metabolic panel -     TSH -     T4, free  GAD (generalized anxiety disorder) Assessment & Plan: Seems deteriorated, more frequent episodes than previously.  Long discussion today regarding treatment options.  He would like to start with a medication to take as needed for symptoms.  He will continue to look for a therapist. Start propranolol 10 mg as needed.  Discussed that he can take 1 to 2 tablets as needed.  If symptoms become more frequent, then would consider sertraline 50 mg daily.  He will update.   Orders: -     Propranolol HCl; Take 1-2 tablets (10-20 mg total) by mouth daily as needed (anxiety).  Dispense: 60 tablet; Refill: 0  Change in stool Assessment & Plan: Discussed to increase fiber intake, water intake, exercise. We discussed fiber supplements as well.  Low suspicion for alpha gal, food sensitivity.  Referral placed to GI per patient request.  Orders: -     Ambulatory referral to Gastroenterology        Doreene Nest, NP

## 2023-08-20 NOTE — Assessment & Plan Note (Signed)
Seems deteriorated, more frequent episodes than previously.  Long discussion today regarding treatment options.  He would like to start with a medication to take as needed for symptoms.  He will continue to look for a therapist. Start propranolol 10 mg as needed.  Discussed that he can take 1 to 2 tablets as needed.  If symptoms become more frequent, then would consider sertraline 50 mg daily.  He will update.

## 2023-08-20 NOTE — Assessment & Plan Note (Signed)
Immunizations UTD. He will complete influenza vaccine at work.  Discussed the importance of a healthy diet and regular exercise in order for weight loss, and to reduce the risk of further co-morbidity.  Exam stable. Labs pending.  Follow up in 1 year for repeat physical.

## 2023-08-20 NOTE — Patient Instructions (Addendum)
Stop by the lab prior to leaving today. I will notify you of your results once received.   You will either be contacted via phone regarding your referral to GI, or you may receive a letter on your MyChart portal from our referral team with instructions for scheduling an appointment. Please let us know if you have not been contacted by anyone within two weeks.  You may take propranolol as needed for panic attacks.  Take 1 to 2 tablets by mouth at onset.  Please notify me if you experience your near fainting episodes again.  It was a pleasure to see you today!

## 2023-08-20 NOTE — Assessment & Plan Note (Signed)
Isolated incidence.  Fortunately, he was evaluated by both paramedics and urgent care.  Would include additional workup if symptoms returned.  He agrees and will update if that is the case.  Continue to work on hydration and good diet.

## 2023-09-03 ENCOUNTER — Other Ambulatory Visit: Payer: Self-pay | Admitting: Primary Care

## 2023-09-03 DIAGNOSIS — F411 Generalized anxiety disorder: Secondary | ICD-10-CM

## 2023-09-14 ENCOUNTER — Other Ambulatory Visit: Payer: Self-pay | Admitting: Primary Care

## 2023-09-14 DIAGNOSIS — F411 Generalized anxiety disorder: Secondary | ICD-10-CM

## 2024-02-08 DIAGNOSIS — R030 Elevated blood-pressure reading, without diagnosis of hypertension: Secondary | ICD-10-CM | POA: Diagnosis not present

## 2024-02-08 DIAGNOSIS — R6889 Other general symptoms and signs: Secondary | ICD-10-CM | POA: Diagnosis not present

## 2024-02-08 DIAGNOSIS — R000 Tachycardia, unspecified: Secondary | ICD-10-CM | POA: Diagnosis not present

## 2024-04-25 ENCOUNTER — Ambulatory Visit: Payer: Self-pay

## 2024-04-25 NOTE — Telephone Encounter (Signed)
 Appt scheduled for 04/27/24

## 2024-04-25 NOTE — Telephone Encounter (Signed)
 Noted, will evaluate.

## 2024-04-25 NOTE — Telephone Encounter (Signed)
 FYI Only or Action Required?: FYI only for provider.  Patient was last seen in primary care on 08/20/2023 by Gretta Comer POUR, NP.  Called Nurse Triage reporting Dysphagia.  Symptoms began about a month ago.  Interventions attempted: Nothing.  Symptoms are: unchanged.  Triage Disposition: See Physician Within 24 Hours  Patient/caregiver understands and will follow disposition?: Yes, will follow disposition  Copied from CRM #[id number]. Topic: Clinical - Red Word Triage >> Apr 25, 2024 11:38 AM Robinson H wrote: [facility] that prompted transfer to Nurse Triage: Issue with swallowing food, part due to stress and anxiety Reason for Disposition  [1] Swallowing difficulty AND [2] cause unknown  (Exception: Difficulty swallowing is a chronic symptom.)  Answer Assessment - Initial Assessment Questions 1. DESCRIPTION: Tell me more about this problem. Are you  having trouble swallowing liquids, solids, or both? Any trouble with swallowing saliva (spit)?     When I go to swallow when eating I feel like my mind is tricking me out and its hard to swallow.  2. SEVERITY: How bad is the swallowing difficulty?  (Scale 1-10; or mild, moderate, severe)     mild 3. ONSET: When did the swallowing problems begin?      month 4. CAUSE: What do you think is causing the problem?  (e.g., dry mouth, food or pill stuck in throat, mouth pain, sore throat, progression of disease process such as dementia or Parkinson's disease).      anxiety 5. CHRONIC or RECURRENT: Is this a new problem for you?  If No, ask: How long have you had this problem? (e.g., days, weeks, months)      New problem 6. OTHER SYMPTOMS: Do you have any other symptoms? (e.g., chest pain, difficulty breathing, mouth sores, sore throat, swollen tongue, chest pain)     denies  Pt states that he has never had a problem with choking on his food, nor choking episode. Would also like to speak to PCP about anxiety medication. Denies  SI/HI.  Protocols used: Swallowing Difficulty-A-AH

## 2024-04-27 ENCOUNTER — Encounter: Payer: Self-pay | Admitting: Primary Care

## 2024-04-27 ENCOUNTER — Ambulatory Visit: Admitting: Primary Care

## 2024-04-27 VITALS — BP 150/76 | HR 79 | Temp 99.3°F | Ht 68.0 in | Wt 138.0 lb

## 2024-04-27 DIAGNOSIS — F411 Generalized anxiety disorder: Secondary | ICD-10-CM

## 2024-04-27 MED ORDER — SERTRALINE HCL 25 MG PO TABS: 25.0000 mg | ORAL_TABLET | Freq: Every day | ORAL | 0 refills | Status: AC

## 2024-04-27 NOTE — Progress Notes (Signed)
   Acute Office Visit  Subjective:     Patient ID: Taylor Carr, male    DOB: June 01, 2001, 23 y.o.   MRN: [id number]  Chief Complaint  Patient presents with   Dysphagia    In the last month and a half has had difficulty swallowing, in the last week this has worsened    HPI Taylor Carr is a 23 year old male with a history of general anxiety disorder and palpitations that presents for difficulty swallowing for 1 month. Episodes are infrequent and last for 30 minutes. When the episodes occur he leans forward and has something to drink to swallow the food with an increased fear of choking. Over the past 2 years he has had an increase in anxiety, usually around August. In no distress at evaluation.   Review of Systems  Constitutional: Negative.   HENT: Negative.    Respiratory: Negative.    Cardiovascular: Negative.   Gastrointestinal: Negative.   Genitourinary: Negative.   Musculoskeletal: Negative.   Skin: Negative.   Neurological: Negative.   Psychiatric/Behavioral:  The patient is nervous/anxious.         Objective:    There were no vitals taken for this visit.   Physical Exam Cardiovascular:     Rate and Rhythm: Normal rate and regular rhythm.  Pulmonary:     Effort: Pulmonary effort is normal.     Breath sounds: Normal breath sounds.  Skin:    General: Skin is warm and dry.     Capillary Refill: Capillary refill takes more than 3 seconds.  Neurological:     Mental Status: He is alert and oriented to person, place, and time.     No results found for any visits on 04/27/24.      Assessment & Plan:   Problem List Items Addressed This Visit   None   No orders of the defined types were placed in this encounter.   No follow-ups on file.  Cortina Vultaggio, RN

## 2024-04-27 NOTE — Patient Instructions (Addendum)
 Start taking Zoloft  (Sertraline ) 25 mg daily.  Discussed common side effects of nausea, indigestion, upset stomach, dry mouth, and jitteriness as well as uncommon side effect of suicidal ideation.  Effects of medication may not be seen immediately, may take up to 6 weeks.  Follow-up in 6 weeks.

## 2024-04-27 NOTE — Progress Notes (Signed)
 Subjective:    Patient ID: Taylor Carr, male    DOB: [birth year], 22 y.o.   MRN: [id number]  Traeson Dusza is a very pleasant 23 y.o. male with a history of near syncope, GAD, sinus tachycardia who presents today to discuss anxiety with eating.   Over the last 1 month he will experience anxiety about swallowing. He's afraid that he will choke if he swallows any food. When he puts food in his mouth he will forget how to eat and swallow so he become anxious and will have to lean forward when eating and drink water to help swallow.  If he is eating and distracted he has no difficulty or symptoms.   He was prescribed propranolol  in January 2025 to use as needed for anxiety.  He has yet to take this as he was afraid of the potential side effects.  He was once prescribed buspirone  to use as needed for flights but never took it.   His anxiety has increased over the last 2 years, mostly worse around August. Symptoms include worrying, feeling anxious, restlessness.      04/27/2024    3:25 PM 08/20/2023   12:48 PM 05/01/2022   10:25 AM 11/01/2021    2:59 PM  GAD 7 : Generalized Anxiety Score  Nervous, Anxious, on Edge 3 3 1  0  Control/stop worrying 3 3 1 1   Worry too much - different things 3 3 1 1   Trouble relaxing 3 3 3  0  Restless 1 2 0 0  Easily annoyed or irritable 0 2 0 0  Afraid - awful might happen 0 3 1 0  Total GAD 7 Score 13 19 7 2   Anxiety Difficulty Not difficult at all Somewhat difficult Not difficult at all         BP Readings from Last 3 Encounters:  04/27/24 (!) 150/76  08/20/23 (!) 142/80  12/11/22 120/76    Wt Readings from Last 3 Encounters:  04/27/24 138 lb (62.6 kg)  08/20/23 140 lb (63.5 kg)  12/11/22 138 lb (62.6 kg)     Review of Systems  Respiratory:  Negative for shortness of breath and wheezing.   Gastrointestinal:  Negative for abdominal pain.       See HPI  Psychiatric/Behavioral:  The patient is nervous/anxious.          Past Medical  History:  Diagnosis Date   Nasal fracture     Social History   Socioeconomic History   Marital status: Single    Spouse name: Not on file   Number of children: Not on file   Years of education: Not on file   Highest education level: Not on file  Occupational History   Not on file  Tobacco Use   Smoking status: Never    Passive exposure: Never   Smokeless tobacco: Never  Vaping Use   Vaping status: Former   Devices: tried in the past  Substance and Sexual Activity   Alcohol use: Yes    Comment: socially - varies, at most on weekends   Drug use: No   Sexual activity: Not on file  Other Topics Concern   Not on file  Social History Narrative   Consulting civil engineer, 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   Lives with mom, dad, brother, sister.   Enjoys playing basketball.   Social Drivers of Corporate investment banker Strain: Not on file  Food Insecurity: Not on file  Transportation Needs: Not on file  Physical Activity: Not on file  Stress: Not on file  Social Connections: Not on file  Intimate Partner Violence: Not on file    Past Surgical History:  Procedure Laterality Date   NASAL FRACTURE SURGERY  11/2017    Family History  Problem Relation Age of Onset   Hyperthyroidism Mother    Panic disorder Mother    Heart Problems Mother        sinus tachycardia   Hypertension Father     No Known Allergies  Current Outpatient Medications on File Prior to Visit  Medication Sig Dispense Refill   fluticasone  (FLONASE ) 50 MCG/ACT nasal spray Place 2 sprays into both nostrils daily. (Patient not taking: Reported on 04/27/2024) 16 g 0   No current facility-administered medications on file prior to visit.    BP (!) 150/76   Pulse 79   Temp 99.3 F (37.4 C) (Temporal)   Ht 5' 8 (1.727 m)   Wt 138 lb (62.6 kg)   SpO2 100%   BMI 20.98 kg/m  Objective:   Physical Exam Cardiovascular:     Rate and Rhythm: Normal rate and regular rhythm.  Pulmonary:     Effort: Pulmonary effort is  normal.     Breath sounds: Normal breath sounds.  Musculoskeletal:     Cervical back: Neck supple.  Skin:    General: Skin is warm and dry.  Neurological:     Mental Status: He is alert and oriented to person, place, and time.  Psychiatric:        Mood and Affect: Mood normal.     Physical Exam        Assessment & Plan:  GAD (generalized anxiety disorder) Assessment & Plan: Symptoms suggestive of anxiety rather than a mechanical issue with swallowing.   Will focus on treating anxiety. We discussed options, he is interested in Zoloft  so we will start. Start Zoloft  (Sertraline ) 25 mg daily.  Discussed common side effects of nausea, indigestion, upset stomach, dry mouth, and jitteriness as well as uncommon side effect of suicidal ideation.   Follow up in 6 weeks.  I evaluated patient, was consulted regarding treatment, and agree with assessment and plan per Kristin Rudd, MSN, FNP student.   Mallie Gaskins, NP-C   Orders: -     Sertraline  HCl; Take 1 tablet (25 mg total) by mouth daily. For anxiety.  Dispense: 90 tablet; Refill: 0    Assessment and Plan Assessment & Plan         Comer MARLA Gaskins, NP    Discussed the use of AI scribe software for clinical note transcription with the patient, who gave verbal consent to proceed.  History of Present Illness

## 2024-04-27 NOTE — Assessment & Plan Note (Addendum)
 Symptoms suggestive of anxiety rather than a mechanical issue with swallowing.   Will focus on treating anxiety. We discussed options, he is interested in Zoloft  so we will start. Start Zoloft  (Sertraline ) 25 mg daily.  Discussed common side effects of nausea, indigestion, upset stomach, dry mouth, and jitteriness as well as uncommon side effect of suicidal ideation.   Follow up in 6 weeks.  I evaluated patient, was consulted regarding treatment, and agree with assessment and plan per Roque Schill, MSN, FNP student.   Mallie Gaskins, NP-C
